# Patient Record
Sex: Male | Born: 1935 | Race: Black or African American | Hispanic: No | Marital: Married | Smoking: Former smoker
Health system: Southern US, Community
[De-identification: ages and names within clinical notes are randomized; demographics above are authoritative.]

## PROBLEM LIST (undated history)

## (undated) DIAGNOSIS — C649 Malignant neoplasm of unspecified kidney, except renal pelvis: Secondary | ICD-10-CM

## (undated) DIAGNOSIS — J449 Chronic obstructive pulmonary disease, unspecified: Secondary | ICD-10-CM

## (undated) DIAGNOSIS — J189 Pneumonia, unspecified organism: Secondary | ICD-10-CM

## (undated) DIAGNOSIS — R12 Heartburn: Secondary | ICD-10-CM

## (undated) DIAGNOSIS — M1990 Unspecified osteoarthritis, unspecified site: Secondary | ICD-10-CM

## (undated) DIAGNOSIS — E119 Type 2 diabetes mellitus without complications: Secondary | ICD-10-CM

## (undated) DIAGNOSIS — K219 Gastro-esophageal reflux disease without esophagitis: Secondary | ICD-10-CM

## (undated) DIAGNOSIS — I10 Essential (primary) hypertension: Secondary | ICD-10-CM

## (undated) HISTORY — DX: Heartburn: R12

## (undated) HISTORY — PX: OTHER SURGICAL HISTORY: SHX169

## (undated) HISTORY — PX: CHOLECYSTECTOMY: SHX55

## (undated) HISTORY — DX: Malignant neoplasm of unspecified kidney, except renal pelvis: C64.9

---

## 2001-06-06 ENCOUNTER — Ambulatory Visit (HOSPITAL_COMMUNITY): Admission: RE | Admit: 2001-06-06 | Discharge: 2001-06-06 | Payer: Self-pay | Admitting: Family Medicine

## 2001-06-06 ENCOUNTER — Encounter: Payer: Self-pay | Admitting: Family Medicine

## 2001-09-16 ENCOUNTER — Ambulatory Visit (HOSPITAL_COMMUNITY): Admission: RE | Admit: 2001-09-16 | Discharge: 2001-09-16 | Payer: Self-pay | Admitting: Family Medicine

## 2001-09-16 ENCOUNTER — Encounter: Payer: Self-pay | Admitting: Family Medicine

## 2002-06-16 ENCOUNTER — Ambulatory Visit (HOSPITAL_COMMUNITY): Admission: RE | Admit: 2002-06-16 | Discharge: 2002-06-16 | Payer: Self-pay | Admitting: Family Medicine

## 2002-06-17 ENCOUNTER — Encounter: Payer: Self-pay | Admitting: Family Medicine

## 2002-10-12 ENCOUNTER — Encounter: Payer: Self-pay | Admitting: Family Medicine

## 2002-10-12 ENCOUNTER — Ambulatory Visit (HOSPITAL_COMMUNITY): Admission: RE | Admit: 2002-10-12 | Discharge: 2002-10-12 | Payer: Self-pay | Admitting: Family Medicine

## 2003-06-01 ENCOUNTER — Emergency Department (HOSPITAL_COMMUNITY): Admission: EM | Admit: 2003-06-01 | Discharge: 2003-06-01 | Payer: Self-pay | Admitting: *Deleted

## 2004-03-21 ENCOUNTER — Ambulatory Visit (HOSPITAL_COMMUNITY): Admission: RE | Admit: 2004-03-21 | Discharge: 2004-03-21 | Payer: Self-pay | Admitting: Internal Medicine

## 2004-04-11 ENCOUNTER — Inpatient Hospital Stay (HOSPITAL_COMMUNITY): Admission: AD | Admit: 2004-04-11 | Discharge: 2004-04-15 | Payer: Self-pay | Admitting: Internal Medicine

## 2004-04-11 ENCOUNTER — Ambulatory Visit (HOSPITAL_COMMUNITY): Admission: RE | Admit: 2004-04-11 | Discharge: 2004-04-11 | Payer: Self-pay | Admitting: Internal Medicine

## 2004-04-20 ENCOUNTER — Ambulatory Visit (HOSPITAL_COMMUNITY): Admission: RE | Admit: 2004-04-20 | Discharge: 2004-04-20 | Payer: Self-pay | Admitting: Internal Medicine

## 2004-04-25 ENCOUNTER — Observation Stay (HOSPITAL_COMMUNITY): Admission: RE | Admit: 2004-04-25 | Discharge: 2004-04-27 | Payer: Self-pay | Admitting: General Surgery

## 2004-06-12 ENCOUNTER — Inpatient Hospital Stay (HOSPITAL_COMMUNITY): Admission: RE | Admit: 2004-06-12 | Discharge: 2004-06-16 | Payer: Self-pay | Admitting: Urology

## 2005-01-15 ENCOUNTER — Ambulatory Visit (HOSPITAL_COMMUNITY): Admission: RE | Admit: 2005-01-15 | Discharge: 2005-01-15 | Payer: Self-pay | Admitting: Internal Medicine

## 2005-02-26 ENCOUNTER — Ambulatory Visit (HOSPITAL_COMMUNITY): Admission: RE | Admit: 2005-02-26 | Discharge: 2005-02-26 | Payer: Self-pay | Admitting: Internal Medicine

## 2005-03-01 ENCOUNTER — Ambulatory Visit (HOSPITAL_COMMUNITY): Admission: RE | Admit: 2005-03-01 | Discharge: 2005-03-01 | Payer: Self-pay | Admitting: Internal Medicine

## 2006-09-23 ENCOUNTER — Ambulatory Visit (HOSPITAL_COMMUNITY): Admission: RE | Admit: 2006-09-23 | Discharge: 2006-09-23 | Payer: Self-pay | Admitting: Internal Medicine

## 2007-07-03 ENCOUNTER — Encounter (INDEPENDENT_AMBULATORY_CARE_PROVIDER_SITE_OTHER): Payer: Self-pay | Admitting: General Surgery

## 2007-07-03 ENCOUNTER — Other Ambulatory Visit: Admission: RE | Admit: 2007-07-03 | Discharge: 2007-07-03 | Payer: Self-pay | Admitting: General Surgery

## 2007-07-07 ENCOUNTER — Ambulatory Visit (HOSPITAL_COMMUNITY): Admission: RE | Admit: 2007-07-07 | Discharge: 2007-07-07 | Payer: Self-pay | Admitting: Internal Medicine

## 2007-11-05 ENCOUNTER — Ambulatory Visit (HOSPITAL_COMMUNITY): Admission: RE | Admit: 2007-11-05 | Discharge: 2007-11-05 | Payer: Self-pay | Admitting: Internal Medicine

## 2010-09-04 ENCOUNTER — Ambulatory Visit (HOSPITAL_COMMUNITY): Admission: RE | Admit: 2010-09-04 | Discharge: 2010-09-05 | Payer: Self-pay | Admitting: Orthopedic Surgery

## 2010-11-26 ENCOUNTER — Encounter: Payer: Self-pay | Admitting: Urology

## 2011-01-17 LAB — CBC
HCT: 43.5 % (ref 39.0–52.0)
Hemoglobin: 14 g/dL (ref 13.0–17.0)
MCHC: 32.2 g/dL (ref 30.0–36.0)
Platelets: 138 10*3/uL — ABNORMAL LOW (ref 150–400)
RDW: 12.5 % (ref 11.5–15.5)

## 2011-01-17 LAB — BASIC METABOLIC PANEL
Calcium: 10 mg/dL (ref 8.4–10.5)
Chloride: 104 mEq/L (ref 96–112)
Creatinine, Ser: 0.84 mg/dL (ref 0.4–1.5)
GFR calc non Af Amer: >60 mL/min (ref >60)
Potassium: 3.9 mEq/L (ref 3.5–5.1)

## 2011-03-23 NOTE — H&P (Signed)
NAME:  Hayden Green, CAVALLERO NO.:  [id number]   MEDICAL RECORD NO.:  [id number]                   PATIENT TYPE:  AMB   LOCATION:  DAY                                  FACILITY:  [facility]   PHYSICIAN:  Jerolyn Shin C. Katrinka Blazing, M.D.                DATE OF BIRTH:  1928/03/28   DATE OF ADMISSION:  DATE OF DISCHARGE:                                HISTORY & PHYSICAL   HISTORY OF PRESENT ILLNESS:  [age]-year-old male with chronic  cholecystitis.  The patient was recently admitted to the hospital for  treatment of  _________.  Please refer to the H&P during that admission.  Since discharge from the hospital the patient has been stable with no nausea  or vomiting.  He has been counseled for laparoscopic cholecystectomy.   PAST HISTORY:  The past history is positive for:  1. Hypertension.  2. Gastroesophageal reflux disease.  3. Hyperlipidemia.  4. Chronic bronchitis.   PHYSICAL EXAMINATION:  GENERAL APPEARANCE:  On exam the patient is in no  acute distress.  VITAL SIGNS:  Blood pressure 120/70, pulse 72, respirations 20 and weight  198 pounds.  HEENT:  Unremarkable.  NECK:  Neck is supple without JVD or bruit.  CHEST:  Chest is clear to auscultation.  HEART:  Regular rate and rhythm without murmur, gallop or rub.  ABDOMEN:  Abdomen is soft and nontender.  No masses.  EXTREMITIES:  No cyanosis, clubbing or edema.  NEUROLOGIC EXAMINATION:  No focal motor, sensory or cerebellar deficits.   IMPRESSION:  1. Chronic cholecystitis.  2. Gastroesophageal reflux disease.  3. Hypertension.  4. Chronic bronchitis.  5. Hyperlipidemia.   PLAN:  Laparoscopic cholecystectomy.   NOTE:  Nursing is to attach a copy of his recent history and physical to  this to complete his history and physical information.     ___________________________________________                                         Dirk Dress Katrinka Blazing, M.D.   LCS/MEDQ  D:  04/24/2004  T:  04/25/2004  Job:   [id number]   cc:   Tesfaye D. Felecia Shelling, M.D.  [address]  [place]  [place] [address]  Fax: [phone]

## 2011-03-23 NOTE — H&P (Signed)
NAME:  Hayden Green, Hayden Green NO.:  [id number]   MEDICAL RECORD NO.:  [id number]                   PATIENT TYPE:  INP   LOCATION:  A323                                 FACILITY:  [facility]   PHYSICIAN:  Tesfaye D. Felecia Shelling, M.D.              DATE OF BIRTH:  09-25-1928   DATE OF ADMISSION:  04/11/2004  DATE OF DISCHARGE:                                HISTORY & PHYSICAL   CHIEF COMPLAINT:  Abdominal pain of four days duration.   HISTORY OF PRESENT ILLNESS:  This is a 77 year old male patient with a  history of hypertension and gastroesophageal reflux disease who came to the  office due to the bowel complaints.  The patient had a severe pain about  four days back, which gradually got better after two days; however, he  continued to have intermittent, crampy pain and nausea.  The patient was  evaluated in the office, and ultrasound of the abdomen was done on as an  outpatient.  His ultrasound showed dilated common bile ducts with  gallbladder sludge __________ .  No stone was visualized in the common bile  duct.  The patient was then admitted for further evaluation and possible  surgery.   REVIEW OF SYSTEMS:  No fevers or chills, cough, chest pain, or shortness of  breath, dysuria, or frequency of urination.  The patient has nausea and  generalized weakness.  His appetite has been very poor.   PAST MEDICAL HISTORY:  1. Hypertension.  2. Gastroesophageal reflux disease.   CURRENT MEDICATIONS:  1. Protonix 40 mg p.o. q.d.  2. Mavik 2 mg p.o. q.d.   SOCIAL HISTORY:  Patient is a retired Education officer, environmental.  Patient has been smoking for  several years.  There is no history of alcohol or substance abuse.   PHYSICAL EXAMINATION:  VITAL SIGNS:  Blood pressure 135/84, pulse 69,  respiration 20, temperature 97.5 degrees Fahrenheit.  GENERAL:  Patient is awake, alert and sick-looking.  LUNGS:  Clear lung fields.  Good air entry.  HEART:  First and second heart sounds heard.  No  murmur or gallop.  ABDOMEN:  Soft.  Lax.  Bowel sounds are positive.  No mass.  No  organomegaly.  EXTREMITIES:  No leg edema.   LABS ON ADMISSION:  CBC:  WBC 7.6, hemoglobin 13.2, hematocrit 41.3,  platelets 205.  Sodium 137, potassium 3.9, chloride 101, carbon dioxide 30,  glucose 120, BUN 12, creatinine 1.1.  Total bilirubin 0.4.  Alkaline  phosphatase 88.  SGOT 18.  SGPT 15.  Total protein 7.5.  Albumin 4.0.  Calcium 7.97.   ASSESSMENT:  This is a 77 year old male patient who presented with abdominal  pain of four days duration.  His pain currently has improved.  His  ultrasound has shown gallbladder sludge and crystal with a dilated common  bile duct; however, there was no visualized stone in his common  bile duct.  His liver function tests are also normal.  Patient could have common bile  duct stone that has passed.   PLAN:  1. Will repeat his LFTs.  2. Will do a GI consult.  3. Will do a surgical consult.  4. Continue his regular medications.     ___________________________________________                                         Eustaquio Maize Felecia Shelling, M.D.   TDF/MEDQ  D:  04/11/2004  T:  04/11/2004  Job:  [id number]

## 2011-03-23 NOTE — Op Note (Signed)
NAME:  Hayden Green, Hayden Green NO.:  [id number]   MEDICAL RECORD NO.:  [id number]                   PATIENT TYPE:  INP   LOCATION:  IC10                                 FACILITY:  [facility]   PHYSICIAN:  Ky Barban, M.D.            DATE OF BIRTH:  03/04/1928   DATE OF PROCEDURE:  06/12/2004  DATE OF DISCHARGE:                                 OPERATIVE REPORT   PREOPERATIVE DIAGNOSIS:  Right renal cell carcinoma.   POSTOPERATIVE DIAGNOSIS:  Right renal cell carcinoma.   PROCEDURE:  Right partial nephrectomy.   ANESTHESIA:  General endotracheal.   SURGEON:  Dr. Alleen Borne   ASSISTANT:  Dr. Dennie Maizes   ESTIMATED BLOOD LOSS:  75 mL.   INSTRUMENT NEEDLE AND SPONGE COUNT:  Correct.   FROZEN SECTION:  Renal cell carcinoma.  Margins clear.   DESCRIPTION OF PROCEDURE:  The patient is given general endotracheal  anesthesia, placed in 45-degree right side up.  Subcostal incision is made,  starting from the midline, ending at the level just below the 12th rib.  The  fascial layers are divided.  The peritoneum was opened.  Here previous  laparoscopic cholecystectomy and a lot of adhesions and scar tissue in the  tracts.  With blunt and sharp dissection, the omental adhesions were  separated from the inside of the peritoneum.  The colon was reflected  medially, and Gerota's fascia was opened posterolateral aspect, and the  lower pole of the kidney was exposed.  I had to open the posterior  peritoneum to open the Gerota's fascia, and the incision in the peritoneum  was carried towards the upper pole.  The upper pole was pulled down with the  help of the retractor and holding the site of the Gerota's fascia with Allis  clamps, the nodule is nicely exposed.  Then under direct vision, this area  was circumscribed and the nodule was sharply taken out.  Grossly, the  margins looked clear.  The specimen was sent for frozen section.  It was  renal cell  carcinoma with clear margins.  The cavity was then pegged with  Gelfoam and with minimal pressure.  After a few minutes, there is no  bleeding going on.  The peritoneum covering this area was closed with a  running stitch of 3-0 Vicryl.  The operative site was thoroughly irrigated  with saline.  The area was left bagged, and the remaining part of the  Gerota's fascia was also closed, and then the area was left bagged, and then  I put a suture to close the incision.  The peritoneum and the inner layer of  the fascia was closed with running stitch of 0 Vicryl and at the end, the  bag which was in the area of the operation was removed.  It was completely  dry.  There was no bleeding going on.  Now, the  second layer of the fascia  was closed with a running stitch of 3-0 Vicryl.  The skin was closed with  staples.  The patient lost about 75 mL of blood,  remained stable, no complications.  Decided not to leave any drain because  we did not violate the collecting system, and there was no bleeding going  on.  Sterile gauze dressing applied.  The patient left the operating room in  satisfactory condition.      ___________________________________________                                            Ky Barban, M.D.   MIJ/MEDQ  D:  06/12/2004  T:  06/12/2004  Job:  [id number]   cc:   Annia Friendly. Loleta Chance, M.D.  [address]  [place]  [place] [address]  Fax: [phone]

## 2011-03-23 NOTE — Discharge Summary (Signed)
NAME:  Hayden Green, Hayden Green NO.:  [id number]   MEDICAL RECORD NO.:  [id number]                   PATIENT TYPE:  INP   LOCATION:  A323                                 FACILITY:  [facility]   PHYSICIAN:  Tesfaye D. Felecia Shelling, M.D.              DATE OF BIRTH:  [birth year]   DATE OF ADMISSION:  04/11/2004  DATE OF DISCHARGE:  04/15/2004                                 DISCHARGE SUMMARY   DISCHARGE DIAGNOSES:  1. Cholecystitis.  2. Dilated common bile duct.  3. Gastroesophageal reflux disease.  4. Hypertension.   DISCHARGE MEDICATIONS:  1. Mavik 2 mg p.o. daily.  2. Protonix 40 mg p.o. daily.   DISPOSITION:  The patient is discharged home in a stable condition.   HOSPITAL COURSE:  This is a 77 year old male patient with a history of  hypertension and gastroesophageal reflux disease.  He was admitted to due to  abdominal pain of four days duration.  An ultrasound that was done as an  outpatient showed a dilated common bile duct with gallstones and sludge in  his gallbladder.  The patient was admitted and a GI and surgical consult was  done.  His liver function was within normal limits.  The patient had an  endoscopy done.  He remained symptomatic.  His nausea and vomiting and  abdominal pain has resolved.  The patient was discharged home with an  appointment with surgery for elective cholecystectomy.     ___________________________________________                                         Eustaquio Maize Felecia Shelling, M.D.   TDF/MEDQ  D:  05/01/2004  T:  05/01/2004  Job:  [id number]

## 2011-03-23 NOTE — Op Note (Signed)
NAME:  Hayden Green, Hayden Green NO.:  [id number]   MEDICAL RECORD NO.:  [id number]                   PATIENT TYPE:  INP   LOCATION:  IC07                                 FACILITY:  [facility]   PHYSICIAN:  Dirk Dress. Katrinka Blazing, M.D.                DATE OF BIRTH:  September 18, 1928   DATE OF PROCEDURE:  04/25/2004  DATE OF DISCHARGE:                                 OPERATIVE REPORT   PREOPERATIVE DIAGNOSES:  Chronic cholecystitis.   POSTOPERATIVE DIAGNOSES:  Chronic cholecystitis.   PROCEDURE:  Laparoscopic cholecystectomy.   SURGEON:  Dirk Dress. Katrinka Blazing, M.D.   DESCRIPTION OF PROCEDURE:  Under general anesthesia, the patient's abdomen  was prepped and draped in a sterile field.  Supraumbilical incision was made  and Veress needle was inserted uneventfully.  Abdomen was insufflated with 3  L of CO2.  Using a Visiport guide, a 10 mm port was placed without  difficulty.  Laparoscope was placed.  Under videoscopic guidance, a 10 mm  port and two 5 mm ports were placed in the right subcostal region.  The  patient was placed in reverse Trendelenburg position, and the gallbladder  was grasped.  Cystic duct and the cystic artery were densely adherent and  chronically inflamed.  After lysed dissection the cystic duct was followed  back to its junction with the common bile duct and to its juncture with the  gallbladder.  At its junction with the gallbladder, four clips were placed.  The duct was then divided.  Cystic artery had 3 branches.  These were  clipped with 3 clips each and divided.  Next, using electrocautery, the  gallbladder was separated from the infrahepatic bed without difficulty.  The  posterior wall of the gallbladder was fibrotic and densely adherent to the  liver.  In the upper aspect of the liver bed, there was an area of constant  oozing.  This was cauterized and then covered with a large sheet of  Surgicel.  J-P drain was placed and brought out through the lateral  port.  Irrigation was carried out.  CO2 was allowed to escape from the abdomen, and  the ports were removed.  The incisions were then closed using 0 Dexon on the  fascia of the supraumbilical port.  The other incisions were closed with  staples.  The J-P drain was sutured with 3-0 nylon.  Sterile dressings were  placed.  The patient was awakened from anesthesia uneventfully, transferred  to a bed, and taken to the postanesthetic care unit for further monitoring.      ___________________________________________                                            Dirk Dress. Katrinka Blazing, M.D.   LCS/MEDQ  D:  04/25/2004  T:  04/26/2004  Job:  [id number]   cc:   Tesfaye D. Felecia Shelling, M.D.  [address]  [place]  [place] [address]  Fax: [phone]

## 2011-03-23 NOTE — Consult Note (Signed)
NAME:  Hayden Green, Hayden Green                            ACCOUNT NO.:  [id number]   MEDICAL RECORD NO.:  [id number]                   PATIENT TYPE:  INP   LOCATION:  A323                                 FACILITY:  [facility]   PHYSICIAN:  Lionel December, M.D.                 DATE OF BIRTH:  [birth year]   DATE OF CONSULTATION:  04/12/2004  DATE OF DISCHARGE:                                   CONSULTATION   GASTROENTEROLOGY CONSULTATION:   REASON FOR CONSULTATION:  Abdominal pain, mildly dilated bile duct.   HISTORY OF PRESENT ILLNESS:  Hayden Green is a 77 year old African-American male  who was admitted to Dr. Letitia Neri service yesterday with recent onset of  abdominal pain.   Patient states he has not felt well for the last 10 days or so.  He had mild  vague pain in his abdomen.  However, for 3 days in a row over the weekend he  had generalized abdominal pain with bloating.  The pain at times was intense  and cramping.  He was not able to rest at night.  He did call Dr. Letitia Neri  office Friday afternoon but it was too late.  He decided not to come to the  emergency room and instead waited.  Two days ago pain had eased.  He saw Dr.  Felecia Shelling.  He had upper abdominal ultrasound yesterday which revealed  gallbladder sludge and/or crystals.  Bile duct was mildly dilated measuring  10 mm.  No common duct stone was noted.  No abnormality noted to pancreas or  kidneys.  Patient was hospitalized for further management.  He states most  of his pain is gone.  He has very mild pain in epigastric area and he feels  bloated.  Patient denies nausea, vomiting, fever or chills.  He also denies  hematuria or dysuria.  He does not have history of kidney stones.  Patient  states that he has not had a good appetite for the last few days and has  lost 8 pounds.  Over the weekend he felt he was constipated.  He took Pepto-  Bismol followed by milk of magnesia and he finally had good results.  There  is no history of melena or  rectal bleeding.  He states his heartburn is well  controlled with therapy.   He is on Pepcid 20 mg b.i.d. and Mavik 2 mg once daily.   He is presently on Mavik 2 mg once daily, Protonix 40 mg every morning, and  Dilaudid 3 mg IV q.3h. p.r.n. pain.   PAST MEDICAL HISTORY:  Chronic GERD.  He has hypertension.  Several years  ago he had what appears to be TURP.   ALLERGIES:  REGLAN.  He had swelling to his mouth and face after first  couple of doses.   FAMILY HISTORY:  One brother died of prostate and perhaps liver malignancy  at age  46.  He has another brother and sister who are in good health.   SOCIAL HISTORY:  He is married; he has four healthy children.  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  He smoked cigarettes for 20-25 years, he was never a heavy smoker and  quit 3 weeks ago.  He does not drink alcohol.   PHYSICAL EXAMINATION:  Pleasant well-developed, well-nourished African-  American male who is in no acute distress, admission weight 191.1 pounds and  he is 66 inches tall.  Pulse 65 per minute, blood pressure 119/68,  respirations 18 and temperature is 98.  Conjunctivae are pink, sclerae are  nonicteric, oropharyngeal mucosa is normal, dentition in satisfactory  condition.  Neck without masses or thyromegaly.  Cardiac exam with regular  rhythm, normal S1 and S3, no murmur or gallop noted.  Lungs are clear to  auscultation.  Abdomen is full, bowel sounds are normal, no bruit is noted,  on palpation it is soft and no definite tenderness noted.  No organomegaly  or masses.  Rectal exam is deferred.  He does not have inguinal hernia.  Examination of his genitalia is within normal limits.  He does not have  clubbing or peripheral edema.   LABORATORIES FROM ADMISSION:  WBC 7.6, H&H 13.2 and 41.3, MCV is 86.3,  platelet count 205K, he had 10% eosinophils.  Sodium 137, potassium 3.9,  chloride 101, CO2 is 30, glucose 120, BUN  12, creatinine 1.1, bilirubin is  0.4, AP 88, AST 18, ALT 15, total protein 7.5 with albumin of 4 and calcium  9.7.  His LFTs are repeated today and they are normal.   ASSESSMENT:  Hayden Green is a 77 year old African-American male who presents  with few day history of generalized abdominal pain described to be quite  intense and cramping associated with anorexia and few pound weight loss.  There is no history of fever, chills, nausea, vomiting, hematemesis, melena,  rectal bleeding, hematuria or dysuria.  He does have sludge and/or crystals  in his gallbladder.  Bile duct is 10 mm but there is no evidence of  choledocholithiasis.  His transaminases are normal.   His abdominal pain appears to be rather nonspecific.  He could have partial  small-bowel obstruction or small bowel disease such as eosinophilic  enteritis but 10% eosinophil count may just be nonspecific.  I agree a  colonic neoplasm needs to be ruled out although he does not have any  symptoms or signs to suggest this.  Dr. Katrinka Blazing has already evaluated the  patient and is planning EGD and colonoscopy.  If these studies are normal I  feel he would need abdominopelvic CT.  So far it does not appear that his  symptoms are biliary in origin.   RECOMMENDATIONS:  We will repeat a CBC with differential in the morning,  also check urinalysis and ask lab to do serum amylase and lipase on  admission blood.   We would like to thank Dr. Felecia Shelling for the opportunity to participate in the  care of this gentleman.      ___________________________________________                                            Lionel December, M.D.   NR/MEDQ  D:  04/12/2004  T:  04/13/2004  Job:  [id number]

## 2011-03-23 NOTE — Consult Note (Signed)
NAME:  Hayden Green, Hayden Green NO.:  [id number]   MEDICAL RECORD NO.:  [id number]                   PATIENT TYPE:  INP   LOCATION:  A323                                 FACILITY:  [facility]   PHYSICIAN:  Dirk Dress. Katrinka Blazing, M.D.                DATE OF BIRTH:  1928/09/30   DATE OF CONSULTATION:  DATE OF DISCHARGE:                                   CONSULTATION   HISTORY OF PRESENT ILLNESS:  This is a 77 year old male who gives a history  of onset of midabdominal pain about a week ago.  The pain was in the mid  periumbilical area.  It was crampy.  He had episodes of nausea but did not  have vomiting.  The pain seemed to get slightly better after a few days, but  then became more severe.  It was extremely severe over the weekend.  He was  seen in the office on Monday of this week and evaluated by Dr. Felecia Shelling.  Ultrasound of the abdomen was done and this showed gallbladder sludge and  common bile duct at 1 cm without any intraductal abnormities.  The patient  has been admitted for symptomatic treatment and further investigation.  Labs  reveal his white count to be normal.  Hemoglobin and hematocrit are normal.  His total bilirubin is 0.4, alkaline phosphatase 88.  SGPT 15, SGOT 18.  He  remains mildly symptomatic with midabdominal discomfort and nausea.   PAST MEDICAL HISTORY:  1. Positive only for hypertension.  2. Gastroesophageal reflux disease.   PHYSICAL EXAMINATION:  VITAL SIGNS:  Blood pressure is 119/68, pulse 65,  respirations 18, temperature 98 degrees.  HEENT:  Unremarkable.  NECK:  Supple.  CHEST:  Clear to auscultation.  HEART:  Regular rate and rhythm without murmur, gallop, or rub.  ABDOMEN:  Mildly distended with doughy quality.  I am unable to get him to  fully relax his abdominal wall.  He has no tenderness and no palpable  organomegaly.  There is no shifting dullness.  EXTREMITIES:  No cyanosis, clubbing, or edema.  NEUROLOGIC:  Nonfocal.   IMPRESSION:  1. Recurrent abdominal pain with associated nausea, abdominal bloating with     change in bowel habits.  Symptoms seem to be exacerbated by meals and     made better with passage of bowel movement.  2. Biliary sludge with a dilated duct of 1 cm but without intraductal     abnormality.  3. Gastroesophageal reflux disease.  4. Hypertension.   RECOMMENDATIONS:  The patient's history and clinical findings are somewhat  atypical for biliary tract disease, though this may very well be the  problem.  Before proceeding with cholecystectomy, however, it is felt that  it would be better to do upper and lower endoscopy.  If this is normal, then  a full abdominal CT would be in order to make  sure that we are not missing some other intraperitoneal pathology.  If these  findings are normal, removal of the gallbladder would then be in order.  This was discussed with the family as well as with the patient and we will  proceed with bowel prep and upper and lower endoscopy in the morning.      ___________________________________________                                            Dirk Dress. Katrinka Blazing, M.D.   LCS/MEDQ  D:  04/12/2004  T:  04/12/2004  Job:  [id number]   cc:   Tesfaye D. Felecia Shelling, M.D.  [address]  [place]  [place] [address]  Fax: [phone]

## 2011-05-16 ENCOUNTER — Ambulatory Visit (HOSPITAL_COMMUNITY)
Admission: RE | Admit: 2011-05-16 | Discharge: 2011-05-16 | Disposition: A | Discharge status: Home / Self Care | Payer: Medicare Other | Source: Ambulatory Visit | Attending: Ophthalmology | Admitting: Ophthalmology

## 2011-05-16 DIAGNOSIS — H269 Unspecified cataract: Secondary | ICD-10-CM | POA: Insufficient documentation

## 2011-05-16 DIAGNOSIS — J449 Chronic obstructive pulmonary disease, unspecified: Secondary | ICD-10-CM | POA: Insufficient documentation

## 2011-05-16 DIAGNOSIS — F17200 Nicotine dependence, unspecified, uncomplicated: Secondary | ICD-10-CM | POA: Insufficient documentation

## 2011-05-16 DIAGNOSIS — J4489 Other specified chronic obstructive pulmonary disease: Secondary | ICD-10-CM | POA: Insufficient documentation

## 2011-05-16 DIAGNOSIS — I10 Essential (primary) hypertension: Secondary | ICD-10-CM | POA: Insufficient documentation

## 2011-05-16 LAB — CBC
MCHC: 33.2 g/dL (ref 30.0–36.0)
Platelets: 135 10*3/uL — ABNORMAL LOW (ref 150–400)
RBC: 4.48 MIL/uL (ref 4.22–5.81)
WBC: 7.4 10*3/uL (ref 4.0–10.5)

## 2011-05-16 LAB — BASIC METABOLIC PANEL
CO2: 29 mEq/L (ref 19–32)
Creatinine, Ser: 0.77 mg/dL (ref 0.50–1.35)
GFR calc non Af Amer: >60 mL/min (ref >60)
Sodium: 141 mEq/L (ref 135–145)

## 2011-09-07 ENCOUNTER — Ambulatory Visit (HOSPITAL_COMMUNITY)
Admission: RE | Admit: 2011-09-07 | Discharge: 2011-09-07 | Disposition: A | Discharge status: Home / Self Care | Payer: Medicare Other | Source: Ambulatory Visit | Attending: Internal Medicine | Admitting: Internal Medicine

## 2011-09-07 ENCOUNTER — Inpatient Hospital Stay (HOSPITAL_COMMUNITY): Payer: Medicare Other

## 2011-09-07 ENCOUNTER — Encounter (HOSPITAL_COMMUNITY): Payer: Self-pay | Admitting: *Deleted

## 2011-09-07 ENCOUNTER — Other Ambulatory Visit (HOSPITAL_COMMUNITY): Payer: Self-pay | Admitting: Internal Medicine

## 2011-09-07 ENCOUNTER — Other Ambulatory Visit: Payer: Self-pay

## 2011-09-07 ENCOUNTER — Inpatient Hospital Stay (HOSPITAL_COMMUNITY)
Admission: AD | Admit: 2011-09-07 | Discharge: 2011-09-11 | DRG: 195 | Disposition: A | Discharge status: Home / Self Care | Payer: Medicare Other | Source: Ambulatory Visit | Attending: Internal Medicine | Admitting: Internal Medicine

## 2011-09-07 DIAGNOSIS — J449 Chronic obstructive pulmonary disease, unspecified: Secondary | ICD-10-CM

## 2011-09-07 DIAGNOSIS — E669 Obesity, unspecified: Secondary | ICD-10-CM | POA: Diagnosis present

## 2011-09-07 DIAGNOSIS — J4489 Other specified chronic obstructive pulmonary disease: Secondary | ICD-10-CM | POA: Diagnosis present

## 2011-09-07 DIAGNOSIS — J189 Pneumonia, unspecified organism: Principal | ICD-10-CM | POA: Diagnosis present

## 2011-09-07 DIAGNOSIS — R918 Other nonspecific abnormal finding of lung field: Secondary | ICD-10-CM | POA: Insufficient documentation

## 2011-09-07 DIAGNOSIS — E785 Hyperlipidemia, unspecified: Secondary | ICD-10-CM | POA: Diagnosis present

## 2011-09-07 DIAGNOSIS — I10 Essential (primary) hypertension: Secondary | ICD-10-CM | POA: Diagnosis present

## 2011-09-07 DIAGNOSIS — Z87891 Personal history of nicotine dependence: Secondary | ICD-10-CM

## 2011-09-07 DIAGNOSIS — R05 Cough: Secondary | ICD-10-CM | POA: Insufficient documentation

## 2011-09-07 DIAGNOSIS — R7309 Other abnormal glucose: Secondary | ICD-10-CM | POA: Diagnosis present

## 2011-09-07 DIAGNOSIS — R059 Cough, unspecified: Secondary | ICD-10-CM | POA: Insufficient documentation

## 2011-09-07 DIAGNOSIS — T380X5A Adverse effect of glucocorticoids and synthetic analogues, initial encounter: Secondary | ICD-10-CM | POA: Diagnosis present

## 2011-09-07 DIAGNOSIS — R0602 Shortness of breath: Secondary | ICD-10-CM | POA: Insufficient documentation

## 2011-09-07 HISTORY — DX: Unspecified osteoarthritis, unspecified site: M19.90

## 2011-09-07 HISTORY — DX: Pneumonia, unspecified organism: J18.9

## 2011-09-07 HISTORY — DX: Essential (primary) hypertension: I10

## 2011-09-07 HISTORY — DX: Gastro-esophageal reflux disease without esophagitis: K21.9

## 2011-09-07 LAB — CBC
HCT: 39.7 % (ref 39.0–52.0)
Hemoglobin: 12.7 g/dL — ABNORMAL LOW (ref 13.0–17.0)
MCHC: 32 g/dL (ref 30.0–36.0)
MCV: 87.3 fL (ref 78.0–100.0)
RDW: 12.7 % (ref 11.5–15.5)

## 2011-09-07 LAB — DIFFERENTIAL
Basophils Absolute: 0 10*3/uL (ref 0.0–0.1)
Basophils Relative: 0 % (ref 0–1)
Eosinophils Relative: 0 % (ref 0–5)
Lymphocytes Relative: 10 % — ABNORMAL LOW (ref 12–46)
Monocytes Absolute: 0.2 10*3/uL (ref 0.1–1.0)
Monocytes Relative: 2 % — ABNORMAL LOW (ref 3–12)

## 2011-09-07 LAB — COMPREHENSIVE METABOLIC PANEL
ALT: 9 U/L (ref 0–53)
Albumin: 3.9 g/dL (ref 3.5–5.2)
Alkaline Phosphatase: 78 U/L (ref 39–117)
Potassium: 3.9 mEq/L (ref 3.5–5.1)
Sodium: 133 mEq/L — ABNORMAL LOW (ref 135–145)
Total Protein: 7.2 g/dL (ref 6.0–8.3)

## 2011-09-07 MED ORDER — IOHEXOL 300 MG/ML  SOLN
80.0000 mL | Freq: Once | INTRAMUSCULAR | Status: AC | PRN
  Administered 2011-09-07: 80 mL via INTRAVENOUS

## 2011-09-07 MED ORDER — CEFTRIAXONE SODIUM 1 G IJ SOLR
1.0000 g | INTRAMUSCULAR | Status: DC
  Filled 2011-09-07 (×3): qty 10

## 2011-09-07 MED ORDER — DEXTROSE 5 % IV SOLN
INTRAVENOUS | Status: AC
  Filled 2011-09-07: qty 500

## 2011-09-07 MED ORDER — SODIUM CHLORIDE 0.45 % IV SOLN
INTRAVENOUS | Status: DC
  Administered 2011-09-07 – 2011-09-08 (×2): via INTRAVENOUS

## 2011-09-07 MED ORDER — IPRATROPIUM BROMIDE 0.02 % IN SOLN
0.5000 mg | RESPIRATORY_TRACT | Status: DC
  Administered 2011-09-07 – 2011-09-11 (×14): 0.5 mg via RESPIRATORY_TRACT
  Filled 2011-09-07 (×13): qty 2.5

## 2011-09-07 MED ORDER — METHYLPREDNISOLONE SODIUM SUCC 125 MG IJ SOLR
125.0000 mg | Freq: Four times a day (QID) | INTRAMUSCULAR | Status: DC
  Administered 2011-09-07 – 2011-09-09 (×7): 125 mg via INTRAVENOUS
  Filled 2011-09-07 (×7): qty 2

## 2011-09-07 MED ORDER — ALBUTEROL SULFATE (5 MG/ML) 0.5% IN NEBU
2.5000 mg | INHALATION_SOLUTION | RESPIRATORY_TRACT | Status: DC
  Administered 2011-09-07 – 2011-09-11 (×14): 2.5 mg via RESPIRATORY_TRACT
  Filled 2011-09-07 (×13): qty 0.5

## 2011-09-07 MED ORDER — DEXTROSE 5 % IV SOLN
500.0000 mg | INTRAVENOUS | Status: DC
  Administered 2011-09-07 – 2011-09-10 (×4): 500 mg via INTRAVENOUS
  Filled 2011-09-07 (×4): qty 500

## 2011-09-07 MED ORDER — LISINOPRIL 10 MG PO TABS
20.0000 mg | ORAL_TABLET | Freq: Every day | ORAL | Status: DC
  Administered 2011-09-07 – 2011-09-11 (×5): 20 mg via ORAL
  Filled 2011-09-07 (×2): qty 2
  Filled 2011-09-07: qty 1
  Filled 2011-09-07 (×2): qty 2

## 2011-09-07 MED ORDER — PANTOPRAZOLE SODIUM 40 MG PO TBEC
40.0000 mg | DELAYED_RELEASE_TABLET | Freq: Every day | ORAL | Status: DC
  Administered 2011-09-07 – 2011-09-10 (×4): 40 mg via ORAL
  Filled 2011-09-07 (×4): qty 1

## 2011-09-07 MED ORDER — DEXTROSE 5 % IV SOLN
INTRAVENOUS | Status: AC
  Filled 2011-09-07: qty 10

## 2011-09-08 ENCOUNTER — Encounter (HOSPITAL_COMMUNITY): Payer: Self-pay | Admitting: *Deleted

## 2011-09-08 LAB — URINALYSIS, ROUTINE W REFLEX MICROSCOPIC
Protein, ur: NEGATIVE mg/dL
pH: 5.5 (ref 5.0–8.0)

## 2011-09-08 LAB — GLUCOSE, CAPILLARY
Glucose-Capillary: 407 mg/dL — ABNORMAL HIGH (ref 70–99)
Glucose-Capillary: 356 mg/dL — ABNORMAL HIGH (ref 70–99)

## 2011-09-08 LAB — HEMOGLOBIN A1C: Hgb A1c MFr Bld: 6.4 % — ABNORMAL HIGH (ref <5.7)

## 2011-09-08 MED ORDER — INSULIN ASPART 100 UNIT/ML ~~LOC~~ SOLN
0.0000 [IU] | Freq: Every day | SUBCUTANEOUS | Status: DC
  Administered 2011-09-08: 5 [IU] via SUBCUTANEOUS

## 2011-09-08 MED ORDER — INSULIN GLARGINE 100 UNIT/ML ~~LOC~~ SOLN
20.0000 [IU] | Freq: Every day | SUBCUTANEOUS | Status: DC
  Administered 2011-09-08 – 2011-09-10 (×3): 20 [IU] via SUBCUTANEOUS
  Filled 2011-09-08: qty 3

## 2011-09-08 MED ORDER — INSULIN ASPART 100 UNIT/ML ~~LOC~~ SOLN
0.0000 [IU] | Freq: Three times a day (TID) | SUBCUTANEOUS | Status: DC
  Administered 2011-09-09: 15 [IU] via SUBCUTANEOUS
  Administered 2011-09-09: 7 [IU] via SUBCUTANEOUS
  Administered 2011-09-09: 15 [IU] via SUBCUTANEOUS
  Administered 2011-09-10: 7 [IU] via SUBCUTANEOUS
  Administered 2011-09-10: 11 [IU] via SUBCUTANEOUS
  Administered 2011-09-10: 15 [IU] via SUBCUTANEOUS
  Filled 2011-09-08 (×2): qty 3

## 2011-09-08 MED ORDER — DEXTROSE 5 % IV SOLN
1.0000 g | INTRAVENOUS | Status: DC
  Administered 2011-09-08 – 2011-09-10 (×3): 1 g via INTRAVENOUS
  Filled 2011-09-08 (×4): qty 10

## 2011-09-08 MED ORDER — INSULIN ASPART 100 UNIT/ML ~~LOC~~ SOLN: 0.0000 [IU] | Freq: Three times a day (TID) | SUBCUTANEOUS | Status: DC

## 2011-09-08 MED ORDER — INSULIN ASPART 100 UNIT/ML ~~LOC~~ SOLN
20.0000 [IU] | Freq: Once | SUBCUTANEOUS | Status: AC
  Administered 2011-09-08: 20 [IU] via SUBCUTANEOUS

## 2011-09-08 NOTE — Progress Notes (Signed)
NAME:  KYLEN, SCHLIEP NO.:  [id number]  MEDICAL RECORD NO.:  [id number]  LOCATION:  A311                          FACILITY:  [facility]  PHYSICIAN:  Neesa Knapik D. Felecia Shelling, MD   DATE OF BIRTH:  Sep 10, 1928  DATE OF PROCEDURE:  09/08/2011 DATE OF DISCHARGE:  09/07/2011                                PROGRESS NOTE   SUBJECTIVE:  The patient feels slightly better.  His cough and wheezing is less.  No fever or chills.  OBJECTIVE:  GENERAL:  The patient is alert, awake, and sick looking. VITAL SIGNS:  Blood pressure 136/59, pulse 69, respiratory rate 18, temperature 97.8 degrees Fahrenheit. CHEST:  Decreased air entry, bilateral rhonchi, and a few expiratory wheezes. CARDIOVASCULAR:  First and second heart sounds heard.  No murmur.  No gallop.  ABDOMEN:  Soft and lax.  Bowel sound is positive.  No mass or organomegaly.  EXTREMITIES:  No leg edema.  ASSESSMENT: 1. Community-acquired pneumonia. 2. Chronic obstructive pulmonary disease. 3. Hyperglycemia. 4. History of hypertension. 5. Hyperlipidemia.  PLAN:  We will continue the patient on IV antibiotics.  Continue on IV steroid.  We will continue to monitor his BNP.  We will do hemoglobin A1c.     Soma Bachand D. Felecia Shelling, MD     TDF/MEDQ  D:  09/08/2011  T:  09/08/2011  Job:  [id number]

## 2011-09-08 NOTE — H&P (Signed)
NAME:  Hayden Green, Hayden Green NO.:  [id number]  MEDICAL RECORD NO.:  [id number]  LOCATION:  A311                          FACILITY:  [facility]  PHYSICIAN:  Aleda Madl D. Felecia Shelling, MD   DATE OF BIRTH:  05-Sep-1928  DATE OF ADMISSION:  09/07/2011 DATE OF DISCHARGE:  LH                             HISTORY & PHYSICAL   CHIEF COMPLAINT:  Cough, shortness of breath, and wheezing.  HISTORY OF PRESENT ILLNESS:  This is an 77 year old male patient with history of multiple medical illnesses who came to the office with above complaints.  He is known to have chronic bronchitis.  He has history of previous tobacco use, apparently however he has stopped smoking.  The patient recently developed cough and wheezing and shortness of breath. No fever or chills.  He was evaluated in the office and the patient was found to have significant expiratory wheezes and rhonchi.  The patient was initially started on oral antibiotics and oral steroid.  Chest x-ray was requested, which showed new left perihilar pulmonary opacity, which was compatible with pneumonia.  The patient was then directly admitted for further treatment.  REVIEW OF SYSTEMS:  The patient has generalized weakness, recurrent cough with brownish sputum.  No fever or chills.  No chest pain, nausea, vomiting, abdominal pain, dysuria, urgency, or frequency of urination.  PAST MEDICAL HISTORY: 1. Hypertension. 2. Gastroesophageal reflux disease. 3. Hyperlipidemia. 4. Chronic bronchitis.  CURRENT MEDICATIONS: 1. Omeprazole 20 mg p.o. daily. 2. Lisinopril 20 mg daily. 3. Albuterol inhaler q.i.d. 4. Zithromax 250 mg daily. 5. Claritin 10 mg p.o. daily.  SOCIAL HISTORY:  The patient has stopped smoking tobacco.  No history of alcohol or substance abuse.  FAMILY HISTORY:  No known history of hypertension, diabetes, or cancer disease.  PHYSICAL EXAMINATION:  GENERAL:  The patient is alert, awake, chronically sick looking. VITAL SIGNS:   Blood pressure 156/69, pulse 69, respiratory rate 18, temperature 97.8. HEENT:  Pupils are equal and reactive. NECK:  Supple. CHEST:  Poor air entry, bilateral rhonchi, end-expiratory wheezes. CARDIOVASCULAR:  First and second heart sounds heard.  No murmur.  No gallop. ABDOMEN:  Soft and lax.  Bowel sound is positive.  No mass or organomegaly. EXTREMITIES:  No leg edema.  Chest x-ray showed new left perihilar opacity compatible with pneumonia. CT scan of the chest also showed patchy nodular opacity in the left upper and lower lobe compatible with multifocal pneumonia.  LABORATORY DATA:  CBC; WBC 10.8, hemoglobin 12.7, hematocrit 39.7, platelets 131.  BMP; sodium 133, potassium 3.9, chloride 98, carbon dioxide 25, blood glucose 323, BUN 9, creatinine 0.9.  ASSESSMENT: 1. Pneumonia. 2. Chronic obstructive pulmonary disease. 3. Hyperglycemia, probably steroid induced. 4. Hypertension. 5. Hyperlipidemia.  PLAN:  We will continue the patient on IV steroid and IV antibiotics. We will do on sliding scale insulin coverage.  We will start him on Lantus insulin.  We will do hemoglobin A1c.  Continue regular medications.     Sokha Craker D. Felecia Shelling, MD     TDF/MEDQ  D:  09/08/2011  T:  09/08/2011  Job:  [id number]

## 2011-09-08 NOTE — Op Note (Signed)
  NAMEORRIN, Hayden Green NO.:  [id number]  MEDICAL RECORD NO.:  [id number]  LOCATION:  SDSC                         FACILITY:  [facility]  PHYSICIAN:  Chalmers Guest, M.D.     DATE OF BIRTH:  [birth year]  DATE OF PROCEDURE:  08/23/2011 DATE OF DISCHARGE:  05/16/2011                              OPERATIVE REPORT   PREOPERATIVE DIAGNOSIS:  Visually significant cataract, right eye.  POSTOPERATIVE DIAGNOSIS:  Visually significant cataract, right eye.  PROCEDURE:  Phacoemulsification intraocular lens implant.  COMPLICATIONS:  None.  ANESTHESIA:  Xylocaine 2%, 50:50 mix of 0.75% Marcaine with ampule of Wydase.  PROCEDURE:  The patient was taken to the operating room, where under monitored anesthesia he was given a peribulbar block with the aforementioned local anesthetic agent.  Following this, with the surgeon sitting temporally the patient's face was prepped and draped in usual sterile fashion.  Operating microscope was in position, a Weck-cel was used to fixate the eye.  A 15-degree blade was used to enter through superior clear cornea.  Viscoat was injected.  Following this, a 2.75-mm keratome blade was used in a stepwise fashion through temporal clear cornea to enter the eye.  A bent 25-gauge needle was used to incise the anterior capsule and a continuous tear curvilinear capsulorrhexis was performed.  BSS was used to hydrodissect and hydrodelineate the nucleus. The phacoemulsification unit was then used to sculpt the nucleus and cracked the nucleus into 4 quadrants.  All nuclear fragments were removed.  The IA was then used to remove cortex and sub-incisional cortex was removed using the main course cannula to elevate the material.  After all nuclear material removed from the eye, the posterior capsule remained intact.  The intraocular lens implant was examined.  It was noted to have no defects.  It was an Alcon AcrySof lens.  SN 60 WF 21.5 diopter lens, SN  #[id number], the lens was placed in the lens injector and injected in the eye position with the Kuglin hook dye was used to remove viscoelastic.  Following this, the eye was pressurized.  There have been no leakage.  All instrumentation removed from the eye.  Topical TobraDex ointment was applied to the eye patch and Fox shield were placed and the patient returned to recovery area in stable condition.  Complications were none.     Chalmers Guest, M.D.     RW/MEDQ  D:  08/23/2011  T:  08/24/2011  Job:  [id number]  Electronically Signed by Chalmers Guest M.D. on 09/08/2011 11:09:17 AM

## 2011-09-09 LAB — GLUCOSE, CAPILLARY
Glucose-Capillary: 215 mg/dL — ABNORMAL HIGH (ref 70–99)
Glucose-Capillary: 304 mg/dL — ABNORMAL HIGH (ref 70–99)
Glucose-Capillary: 250 mg/dL — ABNORMAL HIGH (ref 70–99)

## 2011-09-09 MED ORDER — METHYLPREDNISOLONE SODIUM SUCC 125 MG IJ SOLR
60.0000 mg | Freq: Four times a day (QID) | INTRAMUSCULAR | Status: DC
  Administered 2011-09-09 – 2011-09-10 (×4): 60 mg via INTRAVENOUS
  Filled 2011-09-09 (×4): qty 2

## 2011-09-09 MED ORDER — INSULIN ASPART 100 UNIT/ML ~~LOC~~ SOLN
6.0000 [IU] | Freq: Three times a day (TID) | SUBCUTANEOUS | Status: DC
  Administered 2011-09-09 – 2011-09-10 (×6): 6 [IU] via SUBCUTANEOUS

## 2011-09-09 MED ORDER — SODIUM CHLORIDE 0.9 % IJ SOLN
INTRAMUSCULAR | Status: AC
  Filled 2011-09-09: qty 3

## 2011-09-09 MED ORDER — ENOXAPARIN SODIUM 40 MG/0.4ML ~~LOC~~ SOLN
40.0000 mg | SUBCUTANEOUS | Status: DC
  Administered 2011-09-09 – 2011-09-10 (×2): 40 mg via SUBCUTANEOUS
  Filled 2011-09-09 (×2): qty 0.4

## 2011-09-09 NOTE — Progress Notes (Unsigned)
NAME:  Hayden Green, Hayden Green NO.:  [id number]  MEDICAL RECORD NO.:  [id number]  LOCATION:                                 FACILITY:  PHYSICIAN:  Timmey Lamba D. Felecia Shelling, MD   DATE OF BIRTH:  1928/04/15  DATE OF PROCEDURE:  09/09/2011 DATE OF DISCHARGE:                                PROGRESS NOTE   SUBJECTIVE:  The patient feels better.  His cough and wheezing is improving.  He is started on insulin therapy for hyperglycemia.  OBJECTIVE:  GENERAL/VITAL SIGNS:  Patient is alert and awake and resting with vitals, blood pressure 130/76, pulse 70, respiratory rate 18, temperature 97.9 degrees F. CHEST:  Decreased air entry, bilateral rhonchi, and few scattered expiratory wheezes.  CARDIOVASCULAR SYSTEM:  First and second heart sounds heard.  No murmur.  No gallop.  ABDOMEN:  Soft, and lax.  Bowel sound is positive.  No mass or organomegaly. EXTREMITIES:  No leg edema.  LABS:  Hemoglobin A1c 6.4, blood glucose is between 350-400.  ASSESSMENT: 1. Pneumonia. 2. Chronic obstructive pulmonary disease. 3. Hyperglycemia due to steroid therapy. 4. Hypertension.  PLAN:  Continue patient on IV antibiotics.  We will continue Accu-Chek with a sliding scale.  Continue nebulizer treatment.  We will continue IV steroid.  We will continue supportive care.     Quantay Zaremba D. Felecia Shelling, MD     TDF/MEDQ  D:  09/09/2011  T:  09/09/2011  Job:  [id number]

## 2011-09-10 LAB — GLUCOSE, CAPILLARY: Glucose-Capillary: 223 mg/dL — ABNORMAL HIGH (ref 70–99)

## 2011-09-10 MED ORDER — PREDNISONE 10 MG PO TABS
10.0000 mg | ORAL_TABLET | Freq: Every day | ORAL | Status: DC
  Administered 2011-09-10 – 2011-09-11 (×2): 10 mg via ORAL
  Filled 2011-09-10 (×2): qty 1

## 2011-09-10 MED ORDER — SODIUM CHLORIDE 0.9 % IJ SOLN
INTRAMUSCULAR | Status: AC
  Filled 2011-09-10: qty 3

## 2011-09-10 MED ORDER — IPRATROPIUM BROMIDE 0.02 % IN SOLN
RESPIRATORY_TRACT | Status: AC
  Administered 2011-09-10: 0.5 mg via RESPIRATORY_TRACT
  Filled 2011-09-10: qty 2.5

## 2011-09-10 MED ORDER — ALBUTEROL SULFATE (5 MG/ML) 0.5% IN NEBU
INHALATION_SOLUTION | RESPIRATORY_TRACT | Status: AC
  Administered 2011-09-10: 2.5 mg via RESPIRATORY_TRACT
  Filled 2011-09-10: qty 0.5

## 2011-09-10 NOTE — Progress Notes (Signed)
NAME:  Hayden Green, Hayden Green NO.:  [id number]  MEDICAL RECORD NO.:  [id number]  LOCATION:  RAD                           FACILITY:  [facility]  PHYSICIAN:  Maylie Ashton D. Felecia Shelling, MD   DATE OF BIRTH:  07/13/1928  DATE OF PROCEDURE: DATE OF DISCHARGE:  09/07/2011                                PROGRESS NOTE   SUBJECTIVE:  The patient feels much better.  His cough, wheezing, and shortness of breath is much better.  No fever or chills.  OBJECTIVE:  GENERAL:  The patient is more alert, awake, and resting. VITAL SIGNS:  Blood pressure 134/70, pulse 67, respiratory rate 18, temperature 97.8 degrees Fahrenheit. CHEST:  Decreased air entry, few rhonchi. CARDIOVASCULAR SYSTEM:  First and second heart sounds heard.  No murmur. No gallop.  ABDOMEN:  Soft and lax.  Bowel sound is positive.  No mass or organomegaly. EXTREMITIES:  No leg edema.  LABORATORY DATA:  Blood glucose 258.  ASSESSMENT: 1. Community-acquired pneumonia, clinically improving. 2. Acute exacerbation of chronic obstructive pulmonary disease. 3. Steroid-induced hyperglycemia. 4. Hypertension. 5. Obesity.  PLAN:  Continue the patient on IV antibiotics.  We will continue to taper his IV steroid and we will continue nebulizer treatments.     Nikolas Casher D. Felecia Shelling, MD     TDF/MEDQ  D:  09/10/2011  T:  09/10/2011  Job:  [id number]

## 2011-09-11 MED ORDER — METFORMIN HCL 500 MG PO TABS: 500.0000 mg | ORAL_TABLET | Freq: Two times a day (BID) | ORAL | Status: AC

## 2011-09-11 MED ORDER — AMOXICILLIN-POT CLAVULANATE 500-125 MG PO TABS: 1.0000 | ORAL_TABLET | Freq: Three times a day (TID) | ORAL | Status: AC

## 2011-09-11 NOTE — Discharge Summary (Signed)
NAME:  Hayden Green, XU NO.:  [id number]  MEDICAL RECORD NO.:  [id number]  LOCATION:  A311                          FACILITY:  [facility]  PHYSICIAN:  Vergil Burby D. Felecia Shelling, MD   DATE OF BIRTH:  01-13-1928  DATE OF ADMISSION:  09/07/2011 DATE OF DISCHARGE:  11/06/2012LH                              DISCHARGE SUMMARY   DISCHARGE DIAGNOSES: 1. Pneumonia. 2. Chronic obstructive pulmonary disease. 3. Steroid-induced hyperglycemia. 4. Hypertension. 5. Hyperlipidemia. 6. Obesity.  DISCHARGE MEDICATIONS: 1. Atrovent and Proventil nebulizer q.6 h. p.r.n. 2. Lisinopril 20 mg daily. 3. Claritin 10 mg daily. 4. Prednisolone tapering dose. 5. Augmentin 500 mg p.o. t.i.d. for 5 more days. 6. Glucophage 500 mg p.o. b.i.d. 7. Travoprost 0.004% eye drops 1 drop in each eye at bedtime.  DISPOSITION:  The patient will be discharged to home in a stable condition.  DISCHARGE INSTRUCTIONS:  The patient will continue his current medications.  He will be followed in the office in 1-week duration.  LABS ON DISCHARGE:  Blood glucose 223.  Hemoglobin A1c 6.4.  WBC 10.3, hemoglobin 12.5, hematocrit 39.7, and platelets 131.  BMP; sodium 133, potassium 3.9, chloride 98, carbon dioxide 25.  Blood sugar on admission 323, BUN 9, creatinine 0.91, and calcium 9.9.  HOSPITAL COURSE:  This is an 77 year old male patient with history of hypertension, hyperlipidemia, and chronic bronchitis who was seen in the office due to cough and shortness of breath.  The patient was started on oral steroid and antibiotics and chest x-ray was ordered.  His chest x- ray showed new pneumonia.  The patient was then directly admitted and was treated with IV antibiotics.  Currently, he has improved.  During this hospital admission, he was found to have also hyperglycemia, which may be exacerbated by steroid.  He was started on insulin therapy.  He is being discharged with oral metformin.     Anjel Pardo D. Felecia Shelling,  MD     TDF/MEDQ  D:  09/11/2011  T:  09/11/2011  Job:  [id number]

## 2011-09-11 NOTE — Progress Notes (Signed)
Pt discharged home today per Dr. Felecia Shelling. Pt's VS stable at this time. Pt's IV site D/C'd and WNL. Pt provided with home medication list and discharge instructions. Pt verbalized understanding. Pt left floor via WC accompanied by NT in stable condition. Dagoberto Ligas, RN

## 2011-09-19 ENCOUNTER — Other Ambulatory Visit (HOSPITAL_COMMUNITY): Payer: Self-pay | Admitting: Internal Medicine

## 2011-09-19 ENCOUNTER — Ambulatory Visit (HOSPITAL_COMMUNITY)
Admission: RE | Admit: 2011-09-19 | Discharge: 2011-09-19 | Disposition: A | Discharge status: Home / Self Care | Payer: Medicare Other | Source: Ambulatory Visit | Attending: Internal Medicine | Admitting: Internal Medicine

## 2011-09-19 DIAGNOSIS — J189 Pneumonia, unspecified organism: Secondary | ICD-10-CM

## 2012-04-24 ENCOUNTER — Ambulatory Visit (HOSPITAL_COMMUNITY)
Admission: RE | Admit: 2012-04-24 | Discharge: 2012-04-24 | Disposition: A | Discharge status: Home / Self Care | Payer: Medicare Other | Source: Ambulatory Visit | Attending: Internal Medicine | Admitting: Internal Medicine

## 2012-04-24 ENCOUNTER — Other Ambulatory Visit (HOSPITAL_COMMUNITY): Payer: Self-pay | Admitting: Internal Medicine

## 2012-04-24 DIAGNOSIS — J449 Chronic obstructive pulmonary disease, unspecified: Secondary | ICD-10-CM

## 2012-04-24 DIAGNOSIS — J4489 Other specified chronic obstructive pulmonary disease: Secondary | ICD-10-CM | POA: Insufficient documentation

## 2013-03-17 ENCOUNTER — Ambulatory Visit (HOSPITAL_COMMUNITY)
Admission: RE | Admit: 2013-03-17 | Discharge: 2013-03-17 | Disposition: A | Discharge status: Home / Self Care | Payer: Medicare Other | Source: Ambulatory Visit | Attending: Internal Medicine | Admitting: Internal Medicine

## 2013-03-17 ENCOUNTER — Other Ambulatory Visit (HOSPITAL_COMMUNITY): Payer: Self-pay | Admitting: Internal Medicine

## 2013-03-17 DIAGNOSIS — R0789 Other chest pain: Secondary | ICD-10-CM

## 2013-03-17 DIAGNOSIS — R071 Chest pain on breathing: Secondary | ICD-10-CM | POA: Insufficient documentation

## 2013-03-22 ENCOUNTER — Encounter (HOSPITAL_COMMUNITY): Payer: Self-pay | Admitting: *Deleted

## 2013-03-22 ENCOUNTER — Emergency Department (HOSPITAL_COMMUNITY): Payer: Medicare Other

## 2013-03-22 ENCOUNTER — Emergency Department (HOSPITAL_COMMUNITY)
Admission: EM | Admit: 2013-03-22 | Discharge: 2013-03-23 | Disposition: A | Discharge status: Home / Self Care | Payer: Medicare Other | Attending: Emergency Medicine | Admitting: Emergency Medicine

## 2013-03-22 DIAGNOSIS — Z87891 Personal history of nicotine dependence: Secondary | ICD-10-CM | POA: Insufficient documentation

## 2013-03-22 DIAGNOSIS — R110 Nausea: Secondary | ICD-10-CM

## 2013-03-22 DIAGNOSIS — Z79899 Other long term (current) drug therapy: Secondary | ICD-10-CM | POA: Insufficient documentation

## 2013-03-22 DIAGNOSIS — Z8701 Personal history of pneumonia (recurrent): Secondary | ICD-10-CM | POA: Insufficient documentation

## 2013-03-22 DIAGNOSIS — J4489 Other specified chronic obstructive pulmonary disease: Secondary | ICD-10-CM | POA: Insufficient documentation

## 2013-03-22 DIAGNOSIS — J449 Chronic obstructive pulmonary disease, unspecified: Secondary | ICD-10-CM | POA: Insufficient documentation

## 2013-03-22 DIAGNOSIS — Z8739 Personal history of other diseases of the musculoskeletal system and connective tissue: Secondary | ICD-10-CM | POA: Insufficient documentation

## 2013-03-22 DIAGNOSIS — E119 Type 2 diabetes mellitus without complications: Secondary | ICD-10-CM | POA: Insufficient documentation

## 2013-03-22 DIAGNOSIS — K219 Gastro-esophageal reflux disease without esophagitis: Secondary | ICD-10-CM | POA: Insufficient documentation

## 2013-03-22 DIAGNOSIS — R531 Weakness: Secondary | ICD-10-CM

## 2013-03-22 DIAGNOSIS — R5381 Other malaise: Secondary | ICD-10-CM | POA: Insufficient documentation

## 2013-03-22 DIAGNOSIS — I10 Essential (primary) hypertension: Secondary | ICD-10-CM | POA: Insufficient documentation

## 2013-03-22 HISTORY — DX: Type 2 diabetes mellitus without complications: E11.9

## 2013-03-22 HISTORY — DX: Chronic obstructive pulmonary disease, unspecified: J44.9

## 2013-03-22 LAB — BASIC METABOLIC PANEL
Calcium: 10 mg/dL (ref 8.4–10.5)
GFR calc non Af Amer: 74 mL/min — ABNORMAL LOW (ref >90)
Glucose, Bld: 137 mg/dL — ABNORMAL HIGH (ref 70–99)
Sodium: 142 mEq/L (ref 135–145)

## 2013-03-22 LAB — CBC WITH DIFFERENTIAL/PLATELET
Eosinophils Absolute: 0.7 10*3/uL (ref 0.0–0.7)
Eosinophils Relative: 9 % — ABNORMAL HIGH (ref 0–5)
Lymphs Abs: 2.3 10*3/uL (ref 0.7–4.0)
MCH: 29.7 pg (ref 26.0–34.0)
MCHC: 33.7 g/dL (ref 30.0–36.0)
MCV: 88.2 fL (ref 78.0–100.0)
Platelets: 163 10*3/uL (ref 150–400)
RDW: 12.6 % (ref 11.5–15.5)

## 2013-03-22 NOTE — ED Notes (Signed)
Pt reports general weakness & nausea that started about 2100. Pt denies any fevers or vomiting.

## 2013-03-22 NOTE — ED Provider Notes (Signed)
History  This chart was scribed for [employer]. Colon Branch, MD by Bennett Scrape, ED Scribe. This patient was seen in room APA14/APA14 and the patient's care was started at 11:16 PM.  CSN: [id number]  Arrival date & time 03/22/13  2224   First MD Initiated Contact with Patient 03/22/13 2306      Chief Complaint  Patient presents with  . Weakness  . Nausea     The history is provided by the patient. No language interpreter was used.    HPI Comments: Hayden Green is a 79 y.o. male who presents to the Emergency Department complaining of gradual-onset nausea with associated weakness described as having no energy which began 2 hours ago this evening. Pt reports that he felt so weak that he did not feel like standing up. His wife checked his blood sugar was 77 when it was taken at home. After his blood sugar level was taken, his wife gave him orange juice because she thought his levels were low. Pt reports he has experienced a similar episode less than a month ago that improved without any intervention. He denies dizziness and nausea currently. Last normal BM was 10 hours ago. Five days ago, pt fell while gardening and injured his chest. Pt visited his PCP, and his PCP prescribed him pain medication, but pt states he has not taken the medication for the last two days stating he didn't want to. Pt has a history of DM, COPD, and HTN.   Pt's PCP is Dr. Felecia Shelling  Past Medical History  Diagnosis Date  . Pneumonia   . Hypertension   . GERD (gastroesophageal reflux disease)   . Arthritis   . Diabetes mellitus without complication   . COPD (chronic obstructive pulmonary disease)     Past Surgical History  Procedure Laterality Date  . Abdominal surgery      No family history on file.  History  Substance Use Topics  . Smoking status: Former Games developer  . Smokeless tobacco: Former Neurosurgeon  . Alcohol Use: No      Review of Systems  Constitutional: Negative for fever.       10 systems reviewed and are  negative for acute change except as noted in the HPI  HENT: Negative for congestion.   Eyes: Negative for discharge and redness.  Respiratory: Negative for cough and shortness of breath.   Cardiovascular: Negative for chest pain.  Gastrointestinal: Positive for nausea (now resolved ). Negative for vomiting and abdominal pain.  Musculoskeletal: Negative for back pain.  Skin: Negative for rash.  Neurological: Positive for weakness (now resolved). Negative for syncope, numbness and headaches.  Psychiatric/Behavioral:       No behavior change    Allergies  Reglan  Home Medications   Current Outpatient Rx  Name  Route  Sig  Dispense  Refill  . albuterol (PROVENTIL HFA;VENTOLIN HFA) 108 (90 BASE) MCG/ACT inhaler   Inhalation   Inhale 2 puffs into the lungs every 6 (six) hours as needed.           . brimonidine (ALPHAGAN P) 0.1 % SOLN   Both Eyes   Place 1 drop into both eyes 2 (two) times daily.         . Fluticasone-Salmeterol (ADVAIR) 250-50 MCG/DOSE AEPB   Inhalation   Inhale 1 puff into the lungs every 12 (twelve) hours.         Marland Kitchen HYDROcodone-acetaminophen (NORCO/VICODIN) 5-325 MG per tablet   Oral   Take 1 tablet by  mouth every 6 (six) hours as needed for pain.         Marland Kitchen lisinopril (PRINIVIL,ZESTRIL) 20 MG tablet   Oral   Take 20 mg by mouth daily.           Marland Kitchen loratadine (CLARITIN) 10 MG tablet   Oral   Take 10 mg by mouth daily as needed for allergies.          Marland Kitchen meclizine (ANTIVERT) 25 MG tablet   Oral   Take 25 mg by mouth 4 (four) times daily as needed for dizziness.         . metFORMIN (GLUCOPHAGE) 500 MG tablet   Oral   Take 1 tablet (500 mg total) by mouth 2 (two) times daily with a meal.   60 tablet   1   . omeprazole (PRILOSEC) 20 MG capsule   Oral   Take 20 mg by mouth daily as needed (for acid reflux).         . Travoprost, BAK Free, (TRAVATAMN) 0.004 % SOLN ophthalmic solution   Both Eyes   Place 1 drop into both eyes at bedtime.              Triage Vitals BP 177/70  Pulse 64  Temp(Src) 97.8 F (36.6 C) (Oral)  Resp 20  Ht 5\' 6"  (1.676 m)  Wt 196 lb (88.905 kg)  BMI 31.65 kg/m2  SpO2 99%  Physical Exam  Nursing note and vitals reviewed. Constitutional: He is oriented to person, place, and time. He appears well-developed and well-nourished. No distress.  HENT:  Head: Normocephalic and atraumatic.  Eyes: EOM are normal.  Neck: Neck supple. No tracheal deviation present.  Cardiovascular: Normal rate, regular rhythm and normal heart sounds.   Pulmonary/Chest: Effort normal and breath sounds normal. No respiratory distress.  Musculoskeletal: Normal range of motion.  Neurological: He is alert and oriented to person, place, and time.  Skin: Skin is warm and dry.  Psychiatric: He has a normal mood and affect. His behavior is normal.    ED Course  Procedures (including critical care time) Results for orders placed during the hospital encounter of 03/22/13  CBC WITH DIFFERENTIAL      Result Value Range   WBC 7.1  4.0 - 10.5 K/uL   RBC 4.34  4.22 - 5.81 MIL/uL   Hemoglobin 12.9 (*) 13.0 - 17.0 g/dL   HCT 16.1 (*) 09.6 - 04.5 %   MCV 88.2  78.0 - 100.0 fL   MCH 29.7  26.0 - 34.0 pg   MCHC 33.7  30.0 - 36.0 g/dL   RDW 40.9  81.1 - 91.4 %   Platelets 163  150 - 400 K/uL   Neutrophils Relative % 51  43 - 77 %   Neutro Abs 3.7  1.7 - 7.7 K/uL   Lymphocytes Relative 32  12 - 46 %   Lymphs Abs 2.3  0.7 - 4.0 K/uL   Monocytes Relative 7  3 - 12 %   Monocytes Absolute 0.5  0.1 - 1.0 K/uL   Eosinophils Relative 9 (*) 0 - 5 %   Eosinophils Absolute 0.7  0.0 - 0.7 K/uL   Basophils Relative 0  0 - 1 %   Basophils Absolute 0.0  0.0 - 0.1 K/uL  BASIC METABOLIC PANEL      Result Value Range   Sodium 142  135 - 145 mEq/L   Potassium 4.0  3.5 - 5.1 mEq/L   Chloride 105  96 -  112 mEq/L   CO2 28  19 - 32 mEq/L   Glucose, Bld 137 (*) 70 - 99 mg/dL   BUN 13  6 - 23 mg/dL   Creatinine, Ser 4.09  0.50 - 1.35 mg/dL    Calcium 81.1  8.4 - 10.5 mg/dL   GFR calc non Af Amer 74 (*) >90 mL/min   GFR calc Af Amer 86 (*) >90 mL/min   DIAGNOSTIC STUDIES: Oxygen Saturation is 99% on room air, normal by my interpretation.    COORDINATION OF CARE:  11:20 PM- Discussed results of labs and x-ray with the pt. Advised pt how best to deal with his blood sugar levels. Informed pt that the pain associated with his fall should improve in the next several days. Will prescribe pt an anti-nausea prescription to take as needed. Pt agreed with the treatment plan.    Dg Chest Portable 1 View  03/22/2013   *RADIOLOGY REPORT*  Clinical Data: Weakness.  Nausea.  PORTABLE CHEST - 1 VIEW  Comparison: 03/17/2013  Findings: Heart size remains at the upper limits of normal.  Mild tortuosity thoracic aorta is stable.  Both lungs are clear.  No evidence of pleural effusion.  No mass or lymphadenopathy identified.  IMPRESSION: No active cardiopulmonary disease.   Original Report Authenticated By: Myles Rosenthal, M.D.        MDM  Patient with sudden onset weakness and nausea at home. Blood sugar was 77. He was given orange juice and is now better. Labs are normal, chest xray is normal. Reviewed results with the patient, his wife and daughter. Pt stable in ED with no significant deterioration in condition.The patient appears reasonably screened and/or stabilized for discharge and I doubt any other medical condition or other [facility] requiring further screening, evaluation, or treatment in the ED at this time prior to discharge.  I personally performed the services described in this documentation, which was scribed in my presence. The recorded information has been reviewed and considered.  MDM Reviewed: nursing note and vitals Interpretation: labs and x-ray          Nicoletta Dress. Colon Branch, MD 03/23/13 [phone]

## 2013-03-23 NOTE — ED Notes (Signed)
Pt alert & oriented x4, stable gait. Patient given discharge instructions, paperwork & prescription(s). Patient  instructed to stop at the registration desk to finish any additional paperwork. Patient verbalized understanding. Pt left department w/ no further questions. 

## 2014-12-27 DIAGNOSIS — E119 Type 2 diabetes mellitus without complications: Secondary | ICD-10-CM | POA: Diagnosis not present

## 2014-12-27 DIAGNOSIS — M1990 Unspecified osteoarthritis, unspecified site: Secondary | ICD-10-CM | POA: Diagnosis not present

## 2014-12-27 DIAGNOSIS — J449 Chronic obstructive pulmonary disease, unspecified: Secondary | ICD-10-CM | POA: Diagnosis not present

## 2014-12-27 DIAGNOSIS — I10 Essential (primary) hypertension: Secondary | ICD-10-CM | POA: Diagnosis not present

## 2015-02-21 DIAGNOSIS — E119 Type 2 diabetes mellitus without complications: Secondary | ICD-10-CM | POA: Diagnosis not present

## 2015-02-21 DIAGNOSIS — E1165 Type 2 diabetes mellitus with hyperglycemia: Secondary | ICD-10-CM | POA: Diagnosis not present

## 2015-02-21 DIAGNOSIS — J449 Chronic obstructive pulmonary disease, unspecified: Secondary | ICD-10-CM | POA: Diagnosis not present

## 2015-02-21 DIAGNOSIS — E785 Hyperlipidemia, unspecified: Secondary | ICD-10-CM | POA: Diagnosis not present

## 2015-02-21 DIAGNOSIS — I10 Essential (primary) hypertension: Secondary | ICD-10-CM | POA: Diagnosis not present

## 2015-04-05 DIAGNOSIS — H35033 Hypertensive retinopathy, bilateral: Secondary | ICD-10-CM | POA: Diagnosis not present

## 2015-04-05 DIAGNOSIS — E119 Type 2 diabetes mellitus without complications: Secondary | ICD-10-CM | POA: Diagnosis not present

## 2015-04-05 DIAGNOSIS — H31011 Macula scars of posterior pole (postinflammatory) (post-traumatic), right eye: Secondary | ICD-10-CM | POA: Diagnosis not present

## 2015-04-05 DIAGNOSIS — H4011X2 Primary open-angle glaucoma, moderate stage: Secondary | ICD-10-CM | POA: Diagnosis not present

## 2015-05-23 DIAGNOSIS — J449 Chronic obstructive pulmonary disease, unspecified: Secondary | ICD-10-CM | POA: Diagnosis not present

## 2015-05-23 DIAGNOSIS — M1990 Unspecified osteoarthritis, unspecified site: Secondary | ICD-10-CM | POA: Diagnosis not present

## 2015-05-23 DIAGNOSIS — I10 Essential (primary) hypertension: Secondary | ICD-10-CM | POA: Diagnosis not present

## 2015-05-23 DIAGNOSIS — E119 Type 2 diabetes mellitus without complications: Secondary | ICD-10-CM | POA: Diagnosis not present

## 2015-06-08 DIAGNOSIS — H4011X2 Primary open-angle glaucoma, moderate stage: Secondary | ICD-10-CM | POA: Diagnosis not present

## 2015-07-12 DIAGNOSIS — H4011X2 Primary open-angle glaucoma, moderate stage: Secondary | ICD-10-CM | POA: Diagnosis not present

## 2015-08-22 DIAGNOSIS — J449 Chronic obstructive pulmonary disease, unspecified: Secondary | ICD-10-CM | POA: Diagnosis not present

## 2015-08-22 DIAGNOSIS — E1165 Type 2 diabetes mellitus with hyperglycemia: Secondary | ICD-10-CM | POA: Diagnosis not present

## 2015-08-22 DIAGNOSIS — Z23 Encounter for immunization: Secondary | ICD-10-CM | POA: Diagnosis not present

## 2015-08-22 DIAGNOSIS — I10 Essential (primary) hypertension: Secondary | ICD-10-CM | POA: Diagnosis not present

## 2015-11-03 DIAGNOSIS — J441 Chronic obstructive pulmonary disease with (acute) exacerbation: Secondary | ICD-10-CM | POA: Diagnosis not present

## 2015-11-03 DIAGNOSIS — E119 Type 2 diabetes mellitus without complications: Secondary | ICD-10-CM | POA: Diagnosis not present

## 2016-09-24 ENCOUNTER — Ambulatory Visit (HOSPITAL_COMMUNITY)
Admission: RE | Admit: 2016-09-24 | Discharge: 2016-09-24 | Disposition: A | Discharge status: Home / Self Care | Payer: Medicare Other | Source: Ambulatory Visit | Attending: Internal Medicine | Admitting: Internal Medicine

## 2016-09-24 ENCOUNTER — Other Ambulatory Visit (HOSPITAL_COMMUNITY): Payer: Self-pay | Admitting: Internal Medicine

## 2016-09-24 DIAGNOSIS — R0789 Other chest pain: Secondary | ICD-10-CM

## 2016-11-06 DIAGNOSIS — L0291 Cutaneous abscess, unspecified: Secondary | ICD-10-CM | POA: Diagnosis not present

## 2016-11-06 DIAGNOSIS — E1165 Type 2 diabetes mellitus with hyperglycemia: Secondary | ICD-10-CM | POA: Diagnosis not present

## 2016-11-06 DIAGNOSIS — J449 Chronic obstructive pulmonary disease, unspecified: Secondary | ICD-10-CM | POA: Diagnosis not present

## 2017-02-05 DIAGNOSIS — E1165 Type 2 diabetes mellitus with hyperglycemia: Secondary | ICD-10-CM | POA: Diagnosis not present

## 2017-02-05 DIAGNOSIS — E785 Hyperlipidemia, unspecified: Secondary | ICD-10-CM | POA: Diagnosis not present

## 2017-02-05 DIAGNOSIS — J449 Chronic obstructive pulmonary disease, unspecified: Secondary | ICD-10-CM | POA: Diagnosis not present

## 2017-02-05 DIAGNOSIS — Z0000 Encounter for general adult medical examination without abnormal findings: Secondary | ICD-10-CM | POA: Diagnosis not present

## 2017-02-05 DIAGNOSIS — Z1389 Encounter for screening for other disorder: Secondary | ICD-10-CM | POA: Diagnosis not present

## 2017-02-05 DIAGNOSIS — I10 Essential (primary) hypertension: Secondary | ICD-10-CM | POA: Diagnosis not present

## 2017-02-12 DIAGNOSIS — H04123 Dry eye syndrome of bilateral lacrimal glands: Secondary | ICD-10-CM | POA: Diagnosis not present

## 2017-02-12 DIAGNOSIS — H401132 Primary open-angle glaucoma, bilateral, moderate stage: Secondary | ICD-10-CM | POA: Diagnosis not present

## 2017-05-13 DIAGNOSIS — R1319 Other dysphagia: Secondary | ICD-10-CM | POA: Diagnosis not present

## 2017-05-20 ENCOUNTER — Encounter: Payer: Self-pay | Admitting: Internal Medicine

## 2017-07-12 ENCOUNTER — Other Ambulatory Visit: Payer: Self-pay

## 2017-07-12 ENCOUNTER — Encounter: Payer: Self-pay | Admitting: Gastroenterology

## 2017-07-12 ENCOUNTER — Ambulatory Visit (INDEPENDENT_AMBULATORY_CARE_PROVIDER_SITE_OTHER): Payer: Medicare Other | Admitting: Gastroenterology

## 2017-07-12 DIAGNOSIS — R1310 Dysphagia, unspecified: Secondary | ICD-10-CM | POA: Diagnosis not present

## 2017-07-12 NOTE — Progress Notes (Signed)
Primary Care Physician:  Rosita Fire, MD Primary Gastroenterologist:  Dr. Gala Romney   Chief Complaint  Patient presents with  . Dysphagia    HPI:   Hayden Green is a 83 y.o. male presenting today at the request of his PCP secondary to dysphagia.    Has had solid food dysphagia off and on for several years. States at first he didn't pay attention to it. However, Sunday he was swallowing water and it wouldn't go down. Solid food dysphagia noted as well. States he is just noticing these symptoms more often lately. Tries to chew food well, but sometimes it does not help. No odynophagia. Has chronic GERD. Takes Prilosec daily now, but historically had only taken prn. Noted some improvement with dysphagia taking daily. Will rarely take an Aleve. No unexplained weight loss. Appetite is good. Trying not to gain weight.   Last colonoscopy 10-15 years ago at [facility]. Believes it was by Dr. Tamala Julian. No prior EGD. No concerning lower GI symptoms.    Past Medical History:  Diagnosis Date  . Arthritis   . COPD (chronic obstructive pulmonary disease) ([place])   . Diabetes mellitus without complication ([place])   . GERD (gastroesophageal reflux disease)   . Hypertension   . Pneumonia   . Renal cell carcinoma [facility])     Past Surgical History:  Procedure Laterality Date  . CHOLECYSTECTOMY    . right nephrectomy      Current Outpatient Prescriptions  Medication Sig Dispense Refill  . albuterol (PROVENTIL HFA;VENTOLIN HFA) 108 (90 BASE) MCG/ACT inhaler Inhale 2 puffs into the lungs every 6 (six) hours as needed.      . brimonidine (ALPHAGAN P) 0.1 % SOLN Place 1 drop into both eyes 2 (two) times daily.    . Fluticasone-Salmeterol (ADVAIR) 250-50 MCG/DOSE AEPB Inhale 1 puff into the lungs every 12 (twelve) hours.    Marland Kitchen lisinopril (PRINIVIL,ZESTRIL) 20 MG tablet Take 20 mg by mouth daily.      . meclizine (ANTIVERT) 25 MG tablet Take 25 mg by mouth 4 (four) times daily as needed for dizziness.    .  metFORMIN (GLUCOPHAGE) 500 MG tablet Take 1 tablet (500 mg total) by mouth 2 (two) times daily with a meal. 60 tablet 1  . omeprazole (PRILOSEC) 20 MG capsule Take 20 mg by mouth daily as needed (for acid reflux).    . Travoprost, BAK Free, (TRAVATAMN) 0.004 % SOLN ophthalmic solution Place 1 drop into both eyes at bedtime.       No current facility-administered medications for this visit.     Allergies as of 07/12/2017 - Review Complete 07/12/2017  Allergen Reaction Noted  . Reglan [metoclopramide] Swelling and Other (See Comments) 03/22/2013    Family History  Problem Relation Age of Onset  . Colon cancer Neg Hx   . Colon polyps Neg Hx     Social History   Social History  . Marital status: Married    Spouse name: N/A  . Number of children: N/A  . 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 Smoking status: Former Research scientist (life sciences)  . Smokeless tobacco: Former Systems developer  . Alcohol use No  . Drug use: No  . Sexual activity: Not on file   Other Topics Concern  . Not on file   Social History Narrative  . No narrative on file    Review of  Systems: Gen: Denies any fever, chills, fatigue, weight loss, lack of appetite.  CV: Denies chest pain, heart palpitations, peripheral edema, syncope.  Resp: Denies shortness of breath at rest or with exertion. Denies wheezing or cough.  GI: see HPI  GU : Denies urinary burning, urinary frequency, urinary hesitancy MS: Denies joint pain, muscle weakness, cramps, or limitation of movement.  Derm: Denies rash, itching, dry skin Psych: Denies depression, anxiety, memory loss, and confusion Heme: Denies bruising, bleeding, and enlarged lymph nodes.  Physical Exam: BP 140/79   Pulse 68   Temp 98.2 F (36.8 C) (Oral)   Ht 5\' 6"  (1.676 m)   Wt 189 lb 9.6 oz (86 kg)   BMI 30.60 kg/m  General:   Alert and oriented. Pleasant and cooperative. Well-nourished and well-developed.  Appears younger than stated age.  Head:  Normocephalic and atraumatic. Eyes:  Without icterus, sclera clear and conjunctiva pink.  Ears:  Normal auditory acuity. Nose:  No deformity, discharge,  or lesions. Mouth:  No deformity or lesions, oral mucosa pink.  Lungs:  Clear to auscultation bilaterally. No wheezes, rales, or rhonchi. No distress.  Heart:  S1, S2 present without murmurs appreciated.  Abdomen:  +BS, soft, non-tender and non-distended. No HSM noted. No guarding or rebound. No masses appreciated.  Rectal:  Deferred  Msk:  Mild kyphosis  Extremities:  Without edema. Neurologic:  Alert and  oriented x4;  grossly normal neurologically. Psych:  Alert and cooperative. Normal mood and affect.

## 2017-07-12 NOTE — Patient Instructions (Signed)
Continue Prilosec once daily.   We have set you up for an upper endoscopy with dilation in the near future.

## 2017-07-12 NOTE — Assessment & Plan Note (Signed)
83 year old male with several year history of dysphagia now worsening, although he has noted some mild improvement with taking Prilosec daily instead of prn. No weight loss, abdominal pain, or other alarm features. No prior EGD per his report. Doubt malignancy. Likely related to esophagitis, web, ring, stricture. Unable to exclude age-related motility disorder.  Proceed with upper endoscopy/dilation in the near future with Dr. Gala Romney. The risks, benefits, and alternatives have been discussed in detail with patient. They have stated understanding and desire to proceed.  Continue Prilosec once daily.

## 2017-07-12 NOTE — Patient Instructions (Signed)
PA info for EGD/DIL submitted via [facility] website. No PA needed. Decision ID# [id number].

## 2017-07-15 NOTE — Progress Notes (Signed)
cc'ed to pcp °

## 2017-07-16 DIAGNOSIS — H401132 Primary open-angle glaucoma, bilateral, moderate stage: Secondary | ICD-10-CM | POA: Diagnosis not present

## 2017-07-26 DIAGNOSIS — H401132 Primary open-angle glaucoma, bilateral, moderate stage: Secondary | ICD-10-CM | POA: Diagnosis not present

## 2017-08-07 ENCOUNTER — Encounter (HOSPITAL_COMMUNITY): Payer: Self-pay | Admitting: *Deleted

## 2017-08-07 ENCOUNTER — Encounter (HOSPITAL_COMMUNITY)
Admission: RE | Disposition: A | Discharge status: Home / Self Care | Payer: Self-pay | Source: Ambulatory Visit | Attending: Internal Medicine

## 2017-08-07 ENCOUNTER — Ambulatory Visit (HOSPITAL_COMMUNITY)
Admission: RE | Admit: 2017-08-07 | Discharge: 2017-08-07 | Disposition: A | Discharge status: Home / Self Care | Payer: Medicare Other | Source: Ambulatory Visit | Attending: Internal Medicine | Admitting: Internal Medicine

## 2017-08-07 DIAGNOSIS — E119 Type 2 diabetes mellitus without complications: Secondary | ICD-10-CM | POA: Insufficient documentation

## 2017-08-07 DIAGNOSIS — Q394 Esophageal web: Secondary | ICD-10-CM | POA: Diagnosis not present

## 2017-08-07 DIAGNOSIS — K219 Gastro-esophageal reflux disease without esophagitis: Secondary | ICD-10-CM | POA: Diagnosis not present

## 2017-08-07 DIAGNOSIS — Z7984 Long term (current) use of oral hypoglycemic drugs: Secondary | ICD-10-CM | POA: Diagnosis not present

## 2017-08-07 DIAGNOSIS — Z87891 Personal history of nicotine dependence: Secondary | ICD-10-CM | POA: Diagnosis not present

## 2017-08-07 DIAGNOSIS — I10 Essential (primary) hypertension: Secondary | ICD-10-CM | POA: Diagnosis not present

## 2017-08-07 DIAGNOSIS — Z888 Allergy status to other drugs, medicaments and biological substances status: Secondary | ICD-10-CM | POA: Diagnosis not present

## 2017-08-07 DIAGNOSIS — K449 Diaphragmatic hernia without obstruction or gangrene: Secondary | ICD-10-CM | POA: Insufficient documentation

## 2017-08-07 DIAGNOSIS — R1310 Dysphagia, unspecified: Secondary | ICD-10-CM | POA: Diagnosis not present

## 2017-08-07 DIAGNOSIS — K222 Esophageal obstruction: Secondary | ICD-10-CM | POA: Diagnosis not present

## 2017-08-07 DIAGNOSIS — J449 Chronic obstructive pulmonary disease, unspecified: Secondary | ICD-10-CM | POA: Insufficient documentation

## 2017-08-07 DIAGNOSIS — Z79899 Other long term (current) drug therapy: Secondary | ICD-10-CM | POA: Insufficient documentation

## 2017-08-07 HISTORY — PX: ESOPHAGOGASTRODUODENOSCOPY: SHX5428

## 2017-08-07 HISTORY — PX: MALONEY DILATION: SHX5535

## 2017-08-07 LAB — GLUCOSE, CAPILLARY: GLUCOSE-CAPILLARY: 105 mg/dL — AB (ref 65–99)

## 2017-08-07 SURGERY — EGD (ESOPHAGOGASTRODUODENOSCOPY)
Anesthesia: Moderate Sedation

## 2017-08-07 MED ORDER — MEPERIDINE HCL 100 MG/ML IJ SOLN
INTRAMUSCULAR | Status: DC | PRN
  Administered 2017-08-07: 25 mg via INTRAVENOUS

## 2017-08-07 MED ORDER — STERILE WATER FOR IRRIGATION IR SOLN
Status: DC | PRN
  Administered 2017-08-07: 09:00:00

## 2017-08-07 MED ORDER — SODIUM CHLORIDE 0.9 % IV SOLN
INTRAVENOUS | Status: DC
  Administered 2017-08-07: 08:00:00 via INTRAVENOUS

## 2017-08-07 MED ORDER — ONDANSETRON HCL 4 MG/2ML IJ SOLN
INTRAMUSCULAR | Status: AC
  Filled 2017-08-07: qty 2

## 2017-08-07 MED ORDER — LIDOCAINE VISCOUS 2 % MT SOLN
OROMUCOSAL | Status: AC
  Filled 2017-08-07: qty 15

## 2017-08-07 MED ORDER — MIDAZOLAM HCL 5 MG/5ML IJ SOLN
INTRAMUSCULAR | Status: AC
  Filled 2017-08-07: qty 10

## 2017-08-07 MED ORDER — MIDAZOLAM HCL 5 MG/5ML IJ SOLN
INTRAMUSCULAR | Status: DC | PRN
  Administered 2017-08-07: 2 mg via INTRAVENOUS
  Administered 2017-08-07 (×2): 0.5 mg via INTRAVENOUS

## 2017-08-07 MED ORDER — ONDANSETRON HCL 4 MG/2ML IJ SOLN
INTRAMUSCULAR | Status: DC | PRN
  Administered 2017-08-07: 4 mg via INTRAVENOUS

## 2017-08-07 MED ORDER — MEPERIDINE HCL 100 MG/ML IJ SOLN
INTRAMUSCULAR | Status: AC
  Filled 2017-08-07: qty 2

## 2017-08-07 MED ORDER — LIDOCAINE VISCOUS 2 % MT SOLN
OROMUCOSAL | Status: DC | PRN
  Administered 2017-08-07: 4 mL via OROMUCOSAL

## 2017-08-07 NOTE — Op Note (Signed)
[facility] Patient Name: Hayden Green Procedure Date: 08/07/2017 8:18 AM MRN: [id number] Date of Birth: [birth year] Attending MD: Norvel Richards , MD CSN: [id number] Age: 83 Admit Type: Outpatient Procedure:                Upper GI endoscopy Indications:              Dysphagia Providers:                Norvel Richards, MD, Jeanann Lewandowsky. Sharon Seller, RN,                            Randa Spike, Technician Referring MD:              Medicines:                Midazolam 3 mg IV, Meperidine 25 mg IV Complications:            No immediate complications. Estimated Blood Loss:     Estimated blood loss was minimal. Procedure:                Pre-Anesthesia Assessment:                           - Prior to the procedure, a History and Physical                            was performed, and patient medications and                            allergies were reviewed. The patient's tolerance of                            previous anesthesia was also reviewed. The risks                            and benefits of the procedure and the sedation                            options and risks were discussed with the patient.                            All questions were answered, and informed consent                            was obtained. ASA Grade Assessment: II - A patient                            with mild systemic disease. After reviewing the                            risks and benefits, the patient was deemed in                            satisfactory condition to undergo the procedure.  After obtaining informed consent, the endoscope was                            passed under direct vision. Throughout the                            procedure, the patient's blood pressure, pulse, and                            oxygen saturations were monitored continuously. The                            EG-[id number] ([id number]) scope was introduced through the   mouth, and advanced to the second part of duodenum.                            The upper GI endoscopy was accomplished without                            difficulty. The patient tolerated the procedure                            well. Scope In: 8:46:52 AM Scope Out: 9:00:00 AM Total Procedure Duration: 0 hours 13 minutes 8 seconds  Findings:      A web was found in the proximal esophagus.      A moderate Schatzki ring (acquired) was found at the gastroesophageal       junction. The scope was withdrawn. Dilation was performed with a Maloney       dilator with no resistance at 52 Fr. The scope was withdrawn. Dilation       was performed with a Maloney dilator with mild resistance at 64 Fr. The       dilation site was examined and showed moderate improvement in luminal       narrowing. Estimated blood loss was minimal. Schatzki's ring persisted.       Three-quarter "bites" taken with biopsy forceps addition to disrupted.      A small hiatal hernia was present.      The exam was otherwise without abnormality.      The duodenal bulb and second portion of the duodenum were normal. Impression:               - Web in the proximal esophagus. Dilated                           - Moderate Schatzki ring. Dilated.                           - Small hiatal hernia.                           - The examination was otherwise normal.                           - Normal duodenal bulb and second portion of the  duodenum.                           - No specimens collected. Moderate Sedation:      Moderate (conscious) sedation was administered by the endoscopy nurse       and supervised by the endoscopist. The following parameters were       monitored: oxygen saturation, heart rate, blood pressure, respiratory       rate, EKG, adequacy of pulmonary ventilation, and response to care.       Total physician intraservice time was 18 minutes. Recommendation:           - Patient has a  contact number available for                            emergencies. The signs and symptoms of potential                            delayed complications were discussed with the                            patient. Return to normal activities tomorrow.                            Written discharge instructions were provided to the                            patient.                           - Resume previous diet.                           - Continue present medications. Continue omeprazole                            20 mg daily                           - No repeat upper endoscopy.                           - Return to GI office in 6 months. Procedure Code(s):        --- Professional ---                           [phone], Esophagogastroduodenoscopy, flexible,                            transoral; diagnostic, including collection of                            specimen(s) by brushing or washing, when performed                            (separate procedure)  52841, Dilation of esophagus, by unguided sound or                            bougie, single or multiple passes                           99152, Moderate sedation services provided by the                            same physician or other qualified health care                            professional performing the diagnostic or                            therapeutic service that the sedation supports,                            requiring the presence of an independent trained                            observer to assist in the monitoring of the                            patient's level of consciousness and physiological                            status; initial 15 minutes of intraservice time,                            patient age 20 years or older Diagnosis Code(s):        --- Professional ---                           Q39.4, Esophageal web                           K22.2, Esophageal obstruction                            K44.9, Diaphragmatic hernia without obstruction or                            gangrene                           R13.10, Dysphagia, unspecified CPT copyright 2016 American Medical Association. All rights reserved. The codes documented in this report are preliminary and upon coder review may  be revised to meet current compliance requirements. Cristopher Estimable. Alexya Mcdaris, MD Norvel Richards, MD 08/07/2017 9:10:30 AM This report has been signed electronically. Number of Addenda: 0

## 2017-08-07 NOTE — Discharge Instructions (Signed)
Colonoscopy Discharge Instructions  Read the instructions outlined below and refer to this sheet in the next few weeks. These discharge instructions provide you with general information on caring for yourself after you leave the hospital. Your doctor may also give you specific instructions. While your treatment has been planned according to the most current medical practices available, unavoidable complications occasionally occur. If you have any problems or questions after discharge, call Dr. Gala Romney at [phone]. ACTIVITY  You may resume your regular activity, but move at a slower pace for the next 24 hours.   Take frequent rest periods for the next 24 hours.   Walking will help get rid of the air and reduce the bloated feeling in your belly (abdomen).   No driving for 24 hours (because of the medicine (anesthesia) used during the test).    Do not sign any important legal documents or operate any machinery for 24 hours (because of the anesthesia used during the test).  NUTRITION  Drink plenty of fluids.   You may resume your normal diet as instructed by your doctor.   Begin with a light meal and progress to your normal diet. Heavy or fried foods are harder to digest and may make you feel sick to your stomach (nauseated).   Avoid alcoholic beverages for 24 hours or as instructed.  MEDICATIONS  You may resume your normal medications unless your doctor tells you otherwise.  WHAT YOU CAN EXPECT TODAY  Some feelings of bloating in the abdomen.   Passage of more gas than usual.   Spotting of blood in your stool or on the toilet paper.  IF YOU HAD POLYPS REMOVED DURING THE COLONOSCOPY:  No aspirin products for 7 days or as instructed.   No alcohol for 7 days or as instructed.   Eat a soft diet for the next 24 hours.  FINDING OUT THE RESULTS OF YOUR TEST Not all test results are available during your visit. If your test results are not back during the visit, make an appointment  with your caregiver to find out the results. Do not assume everything is normal if you have not heard from your caregiver or the medical facility. It is important for you to follow up on all of your test results.  SEEK IMMEDIATE MEDICAL ATTENTION IF:  You have more than a spotting of blood in your stool.   Your belly is swollen (abdominal distention).   You are nauseated or vomiting.   You have a temperature over 101.   You have abdominal pain or discomfort that is severe or gets worse throughout the day.    GERD information provided  Continue Prilosec 20 mg daily  Office visit with [place] in 6 months     Gastroesophageal Reflux Disease, Adult Normally, food travels down the esophagus and stays in the stomach to be digested. If a person has gastroesophageal reflux disease (GERD), food and stomach acid move back up into the esophagus. When this happens, the esophagus becomes sore and swollen (inflamed). Over time, GERD can make small holes (ulcers) in the lining of the esophagus. Follow these instructions at home: Diet  Follow a diet as told by your doctor. You may need to avoid foods and drinks such as: ? Coffee and tea (with or without caffeine). ? Drinks that contain alcohol. ? Energy drinks and sports drinks. ? Carbonated drinks or sodas. ? Chocolate and cocoa. ? Peppermint and mint flavorings. ? Garlic and onions. ? Horseradish. ? Spicy and acidic foods,  such as peppers, chili powder, curry powder, vinegar, hot sauces, and BBQ sauce. ? Citrus fruit juices and citrus fruits, such as oranges, lemons, and limes. ? Tomato-based foods, such as red sauce, chili, salsa, and pizza with red sauce. ? Fried and fatty foods, such as donuts, french fries, potato chips, and high-fat dressings. ? High-fat meats, such as hot dogs, rib eye steak, sausage, ham, and bacon. ? High-fat dairy items, such as whole milk, butter, and cream cheese.  Eat small meals often. Avoid eating large  meals.  Avoid drinking large amounts of liquid with your meals.  Avoid eating meals during the 2-3 hours before bedtime.  Avoid lying down right after you eat.  Do not exercise right after you eat. General instructions  Pay attention to any changes in your symptoms.  Take over-the-counter and prescription medicines only as told by your doctor. Do not take aspirin, ibuprofen, or other NSAIDs unless your doctor says it is okay.  Do not use any tobacco products, including cigarettes, chewing tobacco, and e-cigarettes. If you need help quitting, ask your doctor.  Wear loose clothes. Do not wear anything tight around your waist.  Raise (elevate) the head of your bed about 6 inches (15 cm).  Try to lower your stress. If you need help doing this, ask your doctor.  If you are overweight, lose an amount of weight that is healthy for you. Ask your doctor about a safe weight loss goal.  Keep all follow-up visits as told by your doctor. This is important. Contact a doctor if:  You have new symptoms.  You lose weight and you do not know why it is happening.  You have trouble swallowing, or it hurts to swallow.  You have wheezing or a cough that keeps happening.  Your symptoms do not get better with treatment.  You have a hoarse voice. Get help right away if:  You have pain in your arms, neck, jaw, teeth, or back.  You feel sweaty, dizzy, or light-headed.  You have chest pain or shortness of breath.  You throw up (vomit) and your throw up looks like blood or coffee grounds.  You pass out (faint).  Your poop (stool) is bloody or black.  You cannot swallow, drink, or eat. This information is not intended to replace advice given to you by your health care provider. Make sure you discuss any questions you have with your health care provider. Document Released: 04/09/2008 Document Revised: 03/29/2016 Document Reviewed: 02/16/2015 Elsevier Interactive Patient Education  [employer].

## 2017-08-07 NOTE — Interval H&P Note (Signed)
History and Physical Interval Note:  08/07/2017 8:37 AM  Hayden Green  has presented today for surgery, with the diagnosis of dysphagia  The various methods of treatment have been discussed with the patient and family. After consideration of risks, benefits and other options for treatment, the patient has consented to  Procedure(s) with comments: ESOPHAGOGASTRODUODENOSCOPY (EGD) (N/A) - 8:45am MALONEY DILATION (N/A) as a surgical intervention .  The patient's history has been reviewed, patient examined, no change in status, stable for surgery.  I have reviewed the patient's chart and labs.  Questions were answered to the patient's satisfaction.     Robert Rourk  No change. EGD with ED feasible /appropriate.  The risks, benefits, limitations, alternatives and imponderables have been reviewed with the patient. Potential for esophageal dilation, biopsy, etc. have also been reviewed.  Questions have been answered. All parties agreeable.

## 2017-08-07 NOTE — H&P (View-Only) (Signed)
Primary Care Physician:  Rosita Fire, MD Primary Gastroenterologist:  Dr. Gala Romney   Chief Complaint  Patient presents with  . Dysphagia    HPI:   Hayden Green is a 83 y.o. male presenting today at the request of his PCP secondary to dysphagia.    Has had solid food dysphagia off and on for several years. States at first he didn't pay attention to it. However, Sunday he was swallowing water and it wouldn't go down. Solid food dysphagia noted as well. States he is just noticing these symptoms more often lately. Tries to chew food well, but sometimes it does not help. No odynophagia. Has chronic GERD. Takes Prilosec daily now, but historically had only taken prn. Noted some improvement with dysphagia taking daily. Will rarely take an Aleve. No unexplained weight loss. Appetite is good. Trying not to gain weight.   Last colonoscopy 10-15 years ago at [facility]. Believes it was by Dr. Tamala Julian. No prior EGD. No concerning lower GI symptoms.    Past Medical History:  Diagnosis Date  . Arthritis   . COPD (chronic obstructive pulmonary disease) ([place])   . Diabetes mellitus without complication ([place])   . GERD (gastroesophageal reflux disease)   . Hypertension   . Pneumonia   . Renal cell carcinoma [facility])     Past Surgical History:  Procedure Laterality Date  . CHOLECYSTECTOMY    . right nephrectomy      Current Outpatient Prescriptions  Medication Sig Dispense Refill  . albuterol (PROVENTIL HFA;VENTOLIN HFA) 108 (90 BASE) MCG/ACT inhaler Inhale 2 puffs into the lungs every 6 (six) hours as needed.      . brimonidine (ALPHAGAN P) 0.1 % SOLN Place 1 drop into both eyes 2 (two) times daily.    . Fluticasone-Salmeterol (ADVAIR) 250-50 MCG/DOSE AEPB Inhale 1 puff into the lungs every 12 (twelve) hours.    Marland Kitchen lisinopril (PRINIVIL,ZESTRIL) 20 MG tablet Take 20 mg by mouth daily.      . meclizine (ANTIVERT) 25 MG tablet Take 25 mg by mouth 4 (four) times daily as needed for dizziness.    .  metFORMIN (GLUCOPHAGE) 500 MG tablet Take 1 tablet (500 mg total) by mouth 2 (two) times daily with a meal. 60 tablet 1  . omeprazole (PRILOSEC) 20 MG capsule Take 20 mg by mouth daily as needed (for acid reflux).    . Travoprost, BAK Free, (TRAVATAMN) 0.004 % SOLN ophthalmic solution Place 1 drop into both eyes at bedtime.       No current facility-administered medications for this visit.     Allergies as of 07/12/2017 - Review Complete 07/12/2017  Allergen Reaction Noted  . Reglan [metoclopramide] Swelling and Other (See Comments) 03/22/2013    Family History  Problem Relation Age of Onset  . Colon cancer Neg Hx   . Colon polyps Neg Hx     Social History   Social History  . Marital status: Married    Spouse name: N/A  . Number of children: N/A  . 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 Smoking status: Former Research scientist (life sciences)  . Smokeless tobacco: Former Systems developer  . Alcohol use No  . Drug use: No  . Sexual activity: Not on file   Other Topics Concern  . Not on file   Social History Narrative  . No narrative on file    Review of  Systems: Gen: Denies any fever, chills, fatigue, weight loss, lack of appetite.  CV: Denies chest pain, heart palpitations, peripheral edema, syncope.  Resp: Denies shortness of breath at rest or with exertion. Denies wheezing or cough.  GI: see HPI  GU : Denies urinary burning, urinary frequency, urinary hesitancy MS: Denies joint pain, muscle weakness, cramps, or limitation of movement.  Derm: Denies rash, itching, dry skin Psych: Denies depression, anxiety, memory loss, and confusion Heme: Denies bruising, bleeding, and enlarged lymph nodes.  Physical Exam: BP 140/79   Pulse 68   Temp 98.2 F (36.8 C) (Oral)   Ht 5\' 6"  (1.676 m)   Wt 189 lb 9.6 oz (86 kg)   BMI 30.60 kg/m  General:   Alert and oriented. Pleasant and cooperative. Well-nourished and well-developed.  Appears younger than stated age.  Head:  Normocephalic and atraumatic. Eyes:  Without icterus, sclera clear and conjunctiva pink.  Ears:  Normal auditory acuity. Nose:  No deformity, discharge,  or lesions. Mouth:  No deformity or lesions, oral mucosa pink.  Lungs:  Clear to auscultation bilaterally. No wheezes, rales, or rhonchi. No distress.  Heart:  S1, S2 present without murmurs appreciated.  Abdomen:  +BS, soft, non-tender and non-distended. No HSM noted. No guarding or rebound. No masses appreciated.  Rectal:  Deferred  Msk:  Mild kyphosis  Extremities:  Without edema. Neurologic:  Alert and  oriented x4;  grossly normal neurologically. Psych:  Alert and cooperative. Normal mood and affect.

## 2017-08-12 ENCOUNTER — Encounter (HOSPITAL_COMMUNITY): Payer: Self-pay | Admitting: Internal Medicine

## 2017-08-13 DIAGNOSIS — Z23 Encounter for immunization: Secondary | ICD-10-CM | POA: Diagnosis not present

## 2017-08-13 DIAGNOSIS — I10 Essential (primary) hypertension: Secondary | ICD-10-CM | POA: Diagnosis not present

## 2017-08-13 DIAGNOSIS — J449 Chronic obstructive pulmonary disease, unspecified: Secondary | ICD-10-CM | POA: Diagnosis not present

## 2017-08-13 DIAGNOSIS — E119 Type 2 diabetes mellitus without complications: Secondary | ICD-10-CM | POA: Diagnosis not present

## 2017-08-13 DIAGNOSIS — R1310 Dysphagia, unspecified: Secondary | ICD-10-CM | POA: Diagnosis not present

## 2017-08-20 DIAGNOSIS — H401132 Primary open-angle glaucoma, bilateral, moderate stage: Secondary | ICD-10-CM | POA: Diagnosis not present

## 2017-10-07 DIAGNOSIS — E1165 Type 2 diabetes mellitus with hyperglycemia: Secondary | ICD-10-CM | POA: Diagnosis not present

## 2017-10-07 DIAGNOSIS — J441 Chronic obstructive pulmonary disease with (acute) exacerbation: Secondary | ICD-10-CM | POA: Diagnosis not present

## 2017-10-08 DIAGNOSIS — H401132 Primary open-angle glaucoma, bilateral, moderate stage: Secondary | ICD-10-CM | POA: Diagnosis not present

## 2017-11-18 DIAGNOSIS — M170 Bilateral primary osteoarthritis of knee: Secondary | ICD-10-CM | POA: Diagnosis not present

## 2017-11-18 DIAGNOSIS — M549 Dorsalgia, unspecified: Secondary | ICD-10-CM | POA: Diagnosis not present

## 2017-11-18 DIAGNOSIS — J449 Chronic obstructive pulmonary disease, unspecified: Secondary | ICD-10-CM | POA: Diagnosis not present

## 2017-11-18 DIAGNOSIS — E1165 Type 2 diabetes mellitus with hyperglycemia: Secondary | ICD-10-CM | POA: Diagnosis not present

## 2018-01-13 DIAGNOSIS — Z0001 Encounter for general adult medical examination with abnormal findings: Secondary | ICD-10-CM | POA: Diagnosis not present

## 2018-01-13 DIAGNOSIS — E785 Hyperlipidemia, unspecified: Secondary | ICD-10-CM | POA: Diagnosis not present

## 2018-01-13 DIAGNOSIS — J449 Chronic obstructive pulmonary disease, unspecified: Secondary | ICD-10-CM | POA: Diagnosis not present

## 2018-01-13 DIAGNOSIS — Z0000 Encounter for general adult medical examination without abnormal findings: Secondary | ICD-10-CM | POA: Diagnosis not present

## 2018-01-13 DIAGNOSIS — Z1331 Encounter for screening for depression: Secondary | ICD-10-CM | POA: Diagnosis not present

## 2018-01-13 DIAGNOSIS — I10 Essential (primary) hypertension: Secondary | ICD-10-CM | POA: Diagnosis not present

## 2018-01-13 DIAGNOSIS — Z1389 Encounter for screening for other disorder: Secondary | ICD-10-CM | POA: Diagnosis not present

## 2018-01-13 DIAGNOSIS — E1165 Type 2 diabetes mellitus with hyperglycemia: Secondary | ICD-10-CM | POA: Diagnosis not present

## 2018-02-05 ENCOUNTER — Ambulatory Visit: Payer: Medicare Other | Admitting: Gastroenterology

## 2018-02-11 DIAGNOSIS — H401132 Primary open-angle glaucoma, bilateral, moderate stage: Secondary | ICD-10-CM | POA: Diagnosis not present

## 2018-03-10 DIAGNOSIS — I10 Essential (primary) hypertension: Secondary | ICD-10-CM | POA: Diagnosis not present

## 2018-03-10 DIAGNOSIS — L259 Unspecified contact dermatitis, unspecified cause: Secondary | ICD-10-CM | POA: Diagnosis not present

## 2018-03-10 DIAGNOSIS — E1165 Type 2 diabetes mellitus with hyperglycemia: Secondary | ICD-10-CM | POA: Diagnosis not present

## 2018-04-15 DIAGNOSIS — H401132 Primary open-angle glaucoma, bilateral, moderate stage: Secondary | ICD-10-CM | POA: Diagnosis not present

## 2018-04-21 ENCOUNTER — Ambulatory Visit: Payer: Medicare Other | Admitting: Gastroenterology

## 2018-04-21 ENCOUNTER — Encounter: Payer: Self-pay | Admitting: Gastroenterology

## 2018-04-21 VITALS — BP 142/71 | HR 66 | Temp 97.7°F | Ht 66.0 in | Wt 189.2 lb

## 2018-04-21 DIAGNOSIS — R1310 Dysphagia, unspecified: Secondary | ICD-10-CM | POA: Diagnosis not present

## 2018-04-21 NOTE — Patient Instructions (Signed)
Take Prilosec daily each morning, 30 minutes before breakfast for a month. Then, you can take it every other day, first thing in the morning. If you notice you still have reflux symptoms, go back to taking it once a day.   We will see you back in 1 year or sooner if needed!  It was a pleasure to see you today. I strive to create trusting relationships with patients to provide genuine, compassionate, and quality care. I value your feedback. If you receive a survey regarding your visit,  I greatly appreciate you taking time to fill this out.   Annitta Needs, PhD, ANP-BC [facility]

## 2018-04-21 NOTE — Progress Notes (Signed)
Referring Provider: Rosita Fire, MD Primary Care Physician:  Rosita Fire, MD  Primary GI: Dr. Gala Romney   Chief Complaint  Patient presents with  . Dysphagia    f/u, doing ok    HPI:   Hayden Green is a 84 y.o. male presenting today with a history of dysphagia s/p EGD in Oct 2018 with proximal esophageal web. Here for routine follow-up.    Pepper, tomatoes, spicy foods flares up reflux. Otherwise no reflux exacerbations. Dysphagia resolved. No abdominal pain. Started drinking prune juice, which has helped with constipation. One glass a week. No rectal bleeding. Appetite is too good. Omeprazole just as needed. Not taking on empty stomach, usually midday. Baking soda and Tums depending on what he eats.   Past Medical History:  Diagnosis Date  . Arthritis   . COPD (chronic obstructive pulmonary disease) ([place])   . Diabetes mellitus without complication ([place])   . GERD (gastroesophageal reflux disease)   . Hypertension   . Pneumonia   . Renal cell carcinoma [facility])     Past Surgical History:  Procedure Laterality Date  . CHOLECYSTECTOMY    . ESOPHAGOGASTRODUODENOSCOPY N/A 08/07/2017   Dr. Gala Romney: web in proximal esophagus, moderate acquired Schatzki's ring s/p dilation with 63 and 6 F, three-quarter bites with biopsy forceps to facilitate disruption. small hiatal hernia, duodenum normal  . MALONEY DILATION N/A 08/07/2017   Procedure: Venia Minks DILATION;  Surgeon: Daneil Dolin, MD;  [facility];  Service: Endoscopy;  Laterality: N/A;  . right nephrectomy      Current Outpatient Medications  Medication Sig Dispense Refill  . albuterol (PROVENTIL HFA;VENTOLIN HFA) 108 (90 BASE) MCG/ACT inhaler Inhale 2 puffs into the lungs every 6 (six) hours as needed for wheezing or shortness of breath.     . Fluticasone-Salmeterol (ADVAIR) 250-50 MCG/DOSE AEPB Inhale 1 puff into the lungs every 12 (twelve) hours.    Marland Kitchen lisinopril (PRINIVIL,ZESTRIL) 20 MG tablet Take 20 mg by mouth  daily.      . meclizine (ANTIVERT) 25 MG tablet Take 25 mg by mouth 4 (four) times daily as needed for dizziness.    . metFORMIN (GLUCOPHAGE) 500 MG tablet Take 1 tablet (500 mg total) by mouth 2 (two) times daily with a meal. 60 tablet 1  . naproxen sodium (ANAPROX) 220 MG tablet Take 220 mg by mouth daily as needed (pain).    Marland Kitchen omeprazole (PRILOSEC) 20 MG capsule Take 20 mg by mouth daily as needed (for acid reflux).    Marland Kitchen SIMBRINZA 1-0.2 % SUSP Place 1 drop into both eyes 2 (two) times daily.    Marland Kitchen VYZULTA 0.024 % SOLN Place 1 drop into both eyes at bedtime.     No current facility-administered medications for this visit.     Allergies as of 04/21/2018 - Review Complete 04/21/2018  Allergen Reaction Noted  . Reglan [metoclopramide] Swelling and Other (See Comments) 03/22/2013    Family History  Problem Relation Age of Onset  . Colon cancer Neg Hx   . Colon polyps Neg Hx     Social History   Socioeconomic History  . Marital status: Married    Spouse name: Not on file  . Number of children: Not on file  . Years of education: Not on file  . Highest education level: Not on file  Occupational History  . Occupation: Theme park manager    Comment: came out of retirement  Social Needs  . Financial resource strain: Not on file  .  Food insecurity:    Worry: Not on file    Inability: Not on file  . Transportation needs:    Medical: Not on file    Non-medical: Not on file  Tobacco Use  . Smoking status: Former Research scientist (life sciences)  . Smokeless tobacco: Former Network engineer and Sexual Activity  . Alcohol use: No  . Drug use: No  . Sexual activity: Not on file  Lifestyle  . Physical activity:    Days per week: Not on file    Minutes per session: Not on file  . Stress: Not on file  Relationships  . Social connections:    Talks on phone: Not on file    Gets together: Not on file    Attends religious service: Not on file    Active member of club or organization: Not on file    Attends meetings of  clubs or organizations: Not on file    Relationship status: Not on file  Other Topics Concern  . Not on file  Social History Narrative  . Not on file    Review of Systems: Gen: Denies fever, chills, anorexia. Denies fatigue, weakness, weight loss.  CV: Denies chest pain, palpitations, syncope, peripheral edema, and claudication. Resp: Denies dyspnea at rest, cough, wheezing, coughing up blood, and pleurisy. GI: see HPI  Derm: Denies rash, itching, dry skin Psych: Denies depression, anxiety, memory loss, confusion. No homicidal or suicidal ideation.  Heme: Denies bruising, bleeding, and enlarged lymph nodes.  Physical Exam: BP (!) 142/71   Pulse 66   Temp 97.7 F (36.5 C) (Oral)   Ht 5\' 6"  (1.676 m)   Wt 189 lb 3.2 oz (85.8 kg)   BMI 30.54 kg/m  General:   Alert and oriented. No distress noted. Pleasant and cooperative.  Head:  Normocephalic and atraumatic. Eyes:  Conjuctiva clear without scleral icterus. Mouth:  Oral mucosa pink and moist.  Abdomen:  +BS, soft, non-tender and non-distended. No rebound or guarding. No HSM or masses noted. Msk:  Symmetrical without gross deformities. Normal posture. Extremities:  Without edema. Neurologic:  Alert and  oriented x4 Psych:  Alert and cooperative. Normal mood and affect.

## 2018-04-25 NOTE — Assessment & Plan Note (Signed)
Resolved s/p dilation of proximal esophageal web. Intermittent reflux exacerbations and taking Prilosec with flares. Discussed appropriate timing for maximal efficacy. Resume once per day for next 30 days, then go wean to every other day. May need daily but will titrate down to lowest dose that provides symptomatic control as he does not have complicated GERD. Return in 1 year or sooner as needed.

## 2018-04-28 NOTE — Progress Notes (Signed)
cc'ed to pcp °

## 2018-06-16 DIAGNOSIS — J449 Chronic obstructive pulmonary disease, unspecified: Secondary | ICD-10-CM | POA: Diagnosis not present

## 2018-06-16 DIAGNOSIS — I10 Essential (primary) hypertension: Secondary | ICD-10-CM | POA: Diagnosis not present

## 2018-06-16 DIAGNOSIS — E1142 Type 2 diabetes mellitus with diabetic polyneuropathy: Secondary | ICD-10-CM | POA: Diagnosis not present

## 2018-06-16 DIAGNOSIS — K219 Gastro-esophageal reflux disease without esophagitis: Secondary | ICD-10-CM | POA: Diagnosis not present

## 2018-07-15 DIAGNOSIS — H401132 Primary open-angle glaucoma, bilateral, moderate stage: Secondary | ICD-10-CM | POA: Diagnosis not present

## 2018-08-12 DIAGNOSIS — Z23 Encounter for immunization: Secondary | ICD-10-CM | POA: Diagnosis not present

## 2018-08-14 DIAGNOSIS — R42 Dizziness and giddiness: Secondary | ICD-10-CM | POA: Diagnosis not present

## 2018-08-14 DIAGNOSIS — E1165 Type 2 diabetes mellitus with hyperglycemia: Secondary | ICD-10-CM | POA: Diagnosis not present

## 2018-08-29 DIAGNOSIS — H401132 Primary open-angle glaucoma, bilateral, moderate stage: Secondary | ICD-10-CM | POA: Diagnosis not present

## 2018-09-15 DIAGNOSIS — I10 Essential (primary) hypertension: Secondary | ICD-10-CM | POA: Diagnosis not present

## 2018-09-15 DIAGNOSIS — J449 Chronic obstructive pulmonary disease, unspecified: Secondary | ICD-10-CM | POA: Diagnosis not present

## 2018-09-15 DIAGNOSIS — E1142 Type 2 diabetes mellitus with diabetic polyneuropathy: Secondary | ICD-10-CM | POA: Diagnosis not present

## 2018-09-15 DIAGNOSIS — K219 Gastro-esophageal reflux disease without esophagitis: Secondary | ICD-10-CM | POA: Diagnosis not present

## 2018-09-24 DIAGNOSIS — H401132 Primary open-angle glaucoma, bilateral, moderate stage: Secondary | ICD-10-CM | POA: Diagnosis not present

## 2018-11-04 DIAGNOSIS — J209 Acute bronchitis, unspecified: Secondary | ICD-10-CM | POA: Diagnosis not present

## 2018-11-04 DIAGNOSIS — E119 Type 2 diabetes mellitus without complications: Secondary | ICD-10-CM | POA: Diagnosis not present

## 2018-11-04 DIAGNOSIS — I10 Essential (primary) hypertension: Secondary | ICD-10-CM | POA: Diagnosis not present

## 2018-11-04 DIAGNOSIS — J449 Chronic obstructive pulmonary disease, unspecified: Secondary | ICD-10-CM | POA: Diagnosis not present

## 2018-12-29 DIAGNOSIS — E1142 Type 2 diabetes mellitus with diabetic polyneuropathy: Secondary | ICD-10-CM | POA: Diagnosis not present

## 2018-12-29 DIAGNOSIS — Z1389 Encounter for screening for other disorder: Secondary | ICD-10-CM | POA: Diagnosis not present

## 2018-12-29 DIAGNOSIS — Z0001 Encounter for general adult medical examination with abnormal findings: Secondary | ICD-10-CM | POA: Diagnosis not present

## 2018-12-29 DIAGNOSIS — E1165 Type 2 diabetes mellitus with hyperglycemia: Secondary | ICD-10-CM | POA: Diagnosis not present

## 2018-12-29 DIAGNOSIS — I10 Essential (primary) hypertension: Secondary | ICD-10-CM | POA: Diagnosis not present

## 2018-12-29 DIAGNOSIS — Z0000 Encounter for general adult medical examination without abnormal findings: Secondary | ICD-10-CM | POA: Diagnosis not present

## 2018-12-29 DIAGNOSIS — J449 Chronic obstructive pulmonary disease, unspecified: Secondary | ICD-10-CM | POA: Diagnosis not present

## 2018-12-29 DIAGNOSIS — E785 Hyperlipidemia, unspecified: Secondary | ICD-10-CM | POA: Diagnosis not present

## 2019-02-02 DIAGNOSIS — M79672 Pain in left foot: Secondary | ICD-10-CM | POA: Diagnosis not present

## 2019-02-02 DIAGNOSIS — M79671 Pain in right foot: Secondary | ICD-10-CM | POA: Diagnosis not present

## 2019-02-02 DIAGNOSIS — I739 Peripheral vascular disease, unspecified: Secondary | ICD-10-CM | POA: Diagnosis not present

## 2019-02-02 DIAGNOSIS — E1140 Type 2 diabetes mellitus with diabetic neuropathy, unspecified: Secondary | ICD-10-CM | POA: Diagnosis not present

## 2019-02-02 DIAGNOSIS — L110 Acquired keratosis follicularis: Secondary | ICD-10-CM | POA: Diagnosis not present

## 2019-03-20 DIAGNOSIS — H401132 Primary open-angle glaucoma, bilateral, moderate stage: Secondary | ICD-10-CM | POA: Diagnosis not present

## 2019-04-14 ENCOUNTER — Encounter: Payer: Self-pay | Admitting: Internal Medicine

## 2019-04-27 DIAGNOSIS — I739 Peripheral vascular disease, unspecified: Secondary | ICD-10-CM | POA: Diagnosis not present

## 2019-04-27 DIAGNOSIS — M79672 Pain in left foot: Secondary | ICD-10-CM | POA: Diagnosis not present

## 2019-04-27 DIAGNOSIS — M79671 Pain in right foot: Secondary | ICD-10-CM | POA: Diagnosis not present

## 2019-04-27 DIAGNOSIS — E1140 Type 2 diabetes mellitus with diabetic neuropathy, unspecified: Secondary | ICD-10-CM | POA: Diagnosis not present

## 2019-04-27 DIAGNOSIS — E1151 Type 2 diabetes mellitus with diabetic peripheral angiopathy without gangrene: Secondary | ICD-10-CM | POA: Diagnosis not present

## 2019-04-29 DIAGNOSIS — J449 Chronic obstructive pulmonary disease, unspecified: Secondary | ICD-10-CM | POA: Diagnosis not present

## 2019-04-29 DIAGNOSIS — I10 Essential (primary) hypertension: Secondary | ICD-10-CM | POA: Diagnosis not present

## 2019-04-29 DIAGNOSIS — E1142 Type 2 diabetes mellitus with diabetic polyneuropathy: Secondary | ICD-10-CM | POA: Diagnosis not present

## 2019-04-29 DIAGNOSIS — K219 Gastro-esophageal reflux disease without esophagitis: Secondary | ICD-10-CM | POA: Diagnosis not present

## 2019-05-29 DIAGNOSIS — I10 Essential (primary) hypertension: Secondary | ICD-10-CM | POA: Diagnosis not present

## 2019-05-29 DIAGNOSIS — E1165 Type 2 diabetes mellitus with hyperglycemia: Secondary | ICD-10-CM | POA: Diagnosis not present

## 2019-06-29 DIAGNOSIS — E1165 Type 2 diabetes mellitus with hyperglycemia: Secondary | ICD-10-CM | POA: Diagnosis not present

## 2019-06-29 DIAGNOSIS — J449 Chronic obstructive pulmonary disease, unspecified: Secondary | ICD-10-CM | POA: Diagnosis not present

## 2019-07-03 DIAGNOSIS — H401132 Primary open-angle glaucoma, bilateral, moderate stage: Secondary | ICD-10-CM | POA: Diagnosis not present

## 2019-07-08 DIAGNOSIS — H401132 Primary open-angle glaucoma, bilateral, moderate stage: Secondary | ICD-10-CM | POA: Diagnosis not present

## 2019-07-27 DIAGNOSIS — E1139 Type 2 diabetes mellitus with other diabetic ophthalmic complication: Secondary | ICD-10-CM | POA: Diagnosis not present

## 2019-07-27 DIAGNOSIS — Z961 Presence of intraocular lens: Secondary | ICD-10-CM | POA: Diagnosis not present

## 2019-07-27 DIAGNOSIS — Z87891 Personal history of nicotine dependence: Secondary | ICD-10-CM | POA: Diagnosis not present

## 2019-07-27 DIAGNOSIS — H4089 Other specified glaucoma: Secondary | ICD-10-CM | POA: Diagnosis not present

## 2019-07-27 DIAGNOSIS — H4052X4 Glaucoma secondary to other eye disorders, left eye, indeterminate stage: Secondary | ICD-10-CM | POA: Diagnosis not present

## 2019-07-27 DIAGNOSIS — I10 Essential (primary) hypertension: Secondary | ICD-10-CM | POA: Diagnosis not present

## 2019-07-27 DIAGNOSIS — Z7984 Long term (current) use of oral hypoglycemic drugs: Secondary | ICD-10-CM | POA: Diagnosis not present

## 2019-07-27 DIAGNOSIS — Z79899 Other long term (current) drug therapy: Secondary | ICD-10-CM | POA: Diagnosis not present

## 2019-07-27 DIAGNOSIS — J449 Chronic obstructive pulmonary disease, unspecified: Secondary | ICD-10-CM | POA: Diagnosis not present

## 2019-07-30 DIAGNOSIS — J449 Chronic obstructive pulmonary disease, unspecified: Secondary | ICD-10-CM | POA: Diagnosis not present

## 2019-07-30 DIAGNOSIS — E1165 Type 2 diabetes mellitus with hyperglycemia: Secondary | ICD-10-CM | POA: Diagnosis not present

## 2019-08-04 DIAGNOSIS — J449 Chronic obstructive pulmonary disease, unspecified: Secondary | ICD-10-CM | POA: Diagnosis not present

## 2019-08-04 DIAGNOSIS — E1142 Type 2 diabetes mellitus with diabetic polyneuropathy: Secondary | ICD-10-CM | POA: Diagnosis not present

## 2019-08-04 DIAGNOSIS — K219 Gastro-esophageal reflux disease without esophagitis: Secondary | ICD-10-CM | POA: Diagnosis not present

## 2019-08-04 DIAGNOSIS — Z23 Encounter for immunization: Secondary | ICD-10-CM | POA: Diagnosis not present

## 2019-08-04 DIAGNOSIS — I10 Essential (primary) hypertension: Secondary | ICD-10-CM | POA: Diagnosis not present

## 2019-08-05 ENCOUNTER — Other Ambulatory Visit: Payer: Self-pay

## 2019-08-05 NOTE — Patient Outreach (Signed)
[place] [facility]) Care Management  08/05/2019  Hayden Green 02-18-1928 [id number]   Medication Adherence call to Hayden Green Hipp[facility] Identifiers Verify spoke with patient he is past due on Metformin 500 mg patient explain he is taking 1 tablet two times daily he has a few left,patient ask if we can call [facility]. Hayden Green is showing past due under [place].   [place] Management Direct Dial [phone]  Fax [phone] [email or web address]

## 2019-09-03 DIAGNOSIS — M549 Dorsalgia, unspecified: Secondary | ICD-10-CM | POA: Diagnosis not present

## 2019-09-03 DIAGNOSIS — E1165 Type 2 diabetes mellitus with hyperglycemia: Secondary | ICD-10-CM | POA: Diagnosis not present

## 2019-09-21 DIAGNOSIS — E1140 Type 2 diabetes mellitus with diabetic neuropathy, unspecified: Secondary | ICD-10-CM | POA: Diagnosis not present

## 2019-09-21 DIAGNOSIS — L110 Acquired keratosis follicularis: Secondary | ICD-10-CM | POA: Diagnosis not present

## 2019-09-21 DIAGNOSIS — M79672 Pain in left foot: Secondary | ICD-10-CM | POA: Diagnosis not present

## 2019-09-21 DIAGNOSIS — I739 Peripheral vascular disease, unspecified: Secondary | ICD-10-CM | POA: Diagnosis not present

## 2019-09-21 DIAGNOSIS — M79671 Pain in right foot: Secondary | ICD-10-CM | POA: Diagnosis not present

## 2019-10-04 DIAGNOSIS — K219 Gastro-esophageal reflux disease without esophagitis: Secondary | ICD-10-CM | POA: Diagnosis not present

## 2019-10-04 DIAGNOSIS — M549 Dorsalgia, unspecified: Secondary | ICD-10-CM | POA: Diagnosis not present

## 2019-11-03 DIAGNOSIS — I10 Essential (primary) hypertension: Secondary | ICD-10-CM | POA: Diagnosis not present

## 2019-11-03 DIAGNOSIS — E1165 Type 2 diabetes mellitus with hyperglycemia: Secondary | ICD-10-CM | POA: Diagnosis not present

## 2019-11-03 DIAGNOSIS — H401132 Primary open-angle glaucoma, bilateral, moderate stage: Secondary | ICD-10-CM | POA: Diagnosis not present

## 2019-12-04 DIAGNOSIS — M549 Dorsalgia, unspecified: Secondary | ICD-10-CM | POA: Diagnosis not present

## 2019-12-04 DIAGNOSIS — E1165 Type 2 diabetes mellitus with hyperglycemia: Secondary | ICD-10-CM | POA: Diagnosis not present

## 2019-12-08 DIAGNOSIS — H401123 Primary open-angle glaucoma, left eye, severe stage: Secondary | ICD-10-CM | POA: Diagnosis not present

## 2019-12-08 DIAGNOSIS — H401112 Primary open-angle glaucoma, right eye, moderate stage: Secondary | ICD-10-CM | POA: Diagnosis not present

## 2019-12-15 DIAGNOSIS — J449 Chronic obstructive pulmonary disease, unspecified: Secondary | ICD-10-CM | POA: Diagnosis not present

## 2019-12-15 DIAGNOSIS — I10 Essential (primary) hypertension: Secondary | ICD-10-CM | POA: Diagnosis not present

## 2019-12-15 DIAGNOSIS — E1142 Type 2 diabetes mellitus with diabetic polyneuropathy: Secondary | ICD-10-CM | POA: Diagnosis not present

## 2019-12-15 DIAGNOSIS — K219 Gastro-esophageal reflux disease without esophagitis: Secondary | ICD-10-CM | POA: Diagnosis not present

## 2019-12-15 DIAGNOSIS — Z1389 Encounter for screening for other disorder: Secondary | ICD-10-CM | POA: Diagnosis not present

## 2019-12-15 DIAGNOSIS — Z0001 Encounter for general adult medical examination with abnormal findings: Secondary | ICD-10-CM | POA: Diagnosis not present

## 2019-12-29 DIAGNOSIS — M79672 Pain in left foot: Secondary | ICD-10-CM | POA: Diagnosis not present

## 2019-12-29 DIAGNOSIS — E1140 Type 2 diabetes mellitus with diabetic neuropathy, unspecified: Secondary | ICD-10-CM | POA: Diagnosis not present

## 2019-12-29 DIAGNOSIS — M79671 Pain in right foot: Secondary | ICD-10-CM | POA: Diagnosis not present

## 2019-12-29 DIAGNOSIS — I739 Peripheral vascular disease, unspecified: Secondary | ICD-10-CM | POA: Diagnosis not present

## 2019-12-29 DIAGNOSIS — L110 Acquired keratosis follicularis: Secondary | ICD-10-CM | POA: Diagnosis not present

## 2020-01-05 DIAGNOSIS — H401123 Primary open-angle glaucoma, left eye, severe stage: Secondary | ICD-10-CM | POA: Diagnosis not present

## 2020-01-12 DIAGNOSIS — M549 Dorsalgia, unspecified: Secondary | ICD-10-CM | POA: Diagnosis not present

## 2020-01-12 DIAGNOSIS — I10 Essential (primary) hypertension: Secondary | ICD-10-CM | POA: Diagnosis not present

## 2020-02-12 DIAGNOSIS — K219 Gastro-esophageal reflux disease without esophagitis: Secondary | ICD-10-CM | POA: Diagnosis not present

## 2020-02-12 DIAGNOSIS — I10 Essential (primary) hypertension: Secondary | ICD-10-CM | POA: Diagnosis not present

## 2020-02-23 DIAGNOSIS — H4052X4 Glaucoma secondary to other eye disorders, left eye, indeterminate stage: Secondary | ICD-10-CM | POA: Diagnosis not present

## 2020-03-13 DIAGNOSIS — J449 Chronic obstructive pulmonary disease, unspecified: Secondary | ICD-10-CM | POA: Diagnosis not present

## 2020-03-13 DIAGNOSIS — K219 Gastro-esophageal reflux disease without esophagitis: Secondary | ICD-10-CM | POA: Diagnosis not present

## 2020-03-21 DIAGNOSIS — I739 Peripheral vascular disease, unspecified: Secondary | ICD-10-CM | POA: Diagnosis not present

## 2020-03-21 DIAGNOSIS — M79672 Pain in left foot: Secondary | ICD-10-CM | POA: Diagnosis not present

## 2020-03-21 DIAGNOSIS — M79671 Pain in right foot: Secondary | ICD-10-CM | POA: Diagnosis not present

## 2020-03-21 DIAGNOSIS — E1140 Type 2 diabetes mellitus with diabetic neuropathy, unspecified: Secondary | ICD-10-CM | POA: Diagnosis not present

## 2020-03-21 DIAGNOSIS — L110 Acquired keratosis follicularis: Secondary | ICD-10-CM | POA: Diagnosis not present

## 2020-04-13 DIAGNOSIS — M549 Dorsalgia, unspecified: Secondary | ICD-10-CM | POA: Diagnosis not present

## 2020-04-13 DIAGNOSIS — K219 Gastro-esophageal reflux disease without esophagitis: Secondary | ICD-10-CM | POA: Diagnosis not present

## 2020-05-13 DIAGNOSIS — J449 Chronic obstructive pulmonary disease, unspecified: Secondary | ICD-10-CM | POA: Diagnosis not present

## 2020-05-13 DIAGNOSIS — E1165 Type 2 diabetes mellitus with hyperglycemia: Secondary | ICD-10-CM | POA: Diagnosis not present

## 2020-06-06 DIAGNOSIS — I739 Peripheral vascular disease, unspecified: Secondary | ICD-10-CM | POA: Diagnosis not present

## 2020-06-06 DIAGNOSIS — L110 Acquired keratosis follicularis: Secondary | ICD-10-CM | POA: Diagnosis not present

## 2020-06-06 DIAGNOSIS — E1140 Type 2 diabetes mellitus with diabetic neuropathy, unspecified: Secondary | ICD-10-CM | POA: Diagnosis not present

## 2020-06-06 DIAGNOSIS — M79672 Pain in left foot: Secondary | ICD-10-CM | POA: Diagnosis not present

## 2020-06-06 DIAGNOSIS — M79671 Pain in right foot: Secondary | ICD-10-CM | POA: Diagnosis not present

## 2020-06-07 DIAGNOSIS — I10 Essential (primary) hypertension: Secondary | ICD-10-CM | POA: Diagnosis not present

## 2020-06-07 DIAGNOSIS — J449 Chronic obstructive pulmonary disease, unspecified: Secondary | ICD-10-CM | POA: Diagnosis not present

## 2020-06-07 DIAGNOSIS — K219 Gastro-esophageal reflux disease without esophagitis: Secondary | ICD-10-CM | POA: Diagnosis not present

## 2020-06-07 DIAGNOSIS — H401123 Primary open-angle glaucoma, left eye, severe stage: Secondary | ICD-10-CM | POA: Diagnosis not present

## 2020-06-07 DIAGNOSIS — E1142 Type 2 diabetes mellitus with diabetic polyneuropathy: Secondary | ICD-10-CM | POA: Diagnosis not present

## 2020-07-02 ENCOUNTER — Emergency Department (HOSPITAL_COMMUNITY)
Admission: EM | Admit: 2020-07-02 | Discharge: 2020-07-02 | Disposition: A | Discharge status: Home / Self Care | Payer: Medicare Other | Attending: Emergency Medicine | Admitting: Emergency Medicine

## 2020-07-02 ENCOUNTER — Emergency Department (HOSPITAL_COMMUNITY): Payer: Medicare Other

## 2020-07-02 ENCOUNTER — Encounter (HOSPITAL_COMMUNITY): Payer: Self-pay

## 2020-07-02 DIAGNOSIS — Z79899 Other long term (current) drug therapy: Secondary | ICD-10-CM | POA: Insufficient documentation

## 2020-07-02 DIAGNOSIS — Z7984 Long term (current) use of oral hypoglycemic drugs: Secondary | ICD-10-CM | POA: Diagnosis not present

## 2020-07-02 DIAGNOSIS — R310 Gross hematuria: Secondary | ICD-10-CM | POA: Diagnosis not present

## 2020-07-02 DIAGNOSIS — J449 Chronic obstructive pulmonary disease, unspecified: Secondary | ICD-10-CM | POA: Diagnosis not present

## 2020-07-02 DIAGNOSIS — E119 Type 2 diabetes mellitus without complications: Secondary | ICD-10-CM | POA: Insufficient documentation

## 2020-07-02 DIAGNOSIS — Z87891 Personal history of nicotine dependence: Secondary | ICD-10-CM | POA: Insufficient documentation

## 2020-07-02 DIAGNOSIS — I700 Atherosclerosis of aorta: Secondary | ICD-10-CM | POA: Diagnosis not present

## 2020-07-02 DIAGNOSIS — Z7951 Long term (current) use of inhaled steroids: Secondary | ICD-10-CM | POA: Diagnosis not present

## 2020-07-02 DIAGNOSIS — K5730 Diverticulosis of large intestine without perforation or abscess without bleeding: Secondary | ICD-10-CM | POA: Diagnosis not present

## 2020-07-02 DIAGNOSIS — Z85528 Personal history of other malignant neoplasm of kidney: Secondary | ICD-10-CM | POA: Insufficient documentation

## 2020-07-02 DIAGNOSIS — I10 Essential (primary) hypertension: Secondary | ICD-10-CM | POA: Diagnosis not present

## 2020-07-02 DIAGNOSIS — E278 Other specified disorders of adrenal gland: Secondary | ICD-10-CM | POA: Diagnosis not present

## 2020-07-02 LAB — CBC WITH DIFFERENTIAL/PLATELET
Abs Immature Granulocytes: 0.01 10*3/uL (ref 0.00–0.07)
Basophils Absolute: 0 10*3/uL (ref 0.0–0.1)
Basophils Relative: 0 %
Eosinophils Absolute: 0.8 10*3/uL — ABNORMAL HIGH (ref 0.0–0.5)
Eosinophils Relative: 13 %
HCT: 38.1 % — ABNORMAL LOW (ref 39.0–52.0)
Hemoglobin: 11.7 g/dL — ABNORMAL LOW (ref 13.0–17.0)
Immature Granulocytes: 0 %
Lymphocytes Relative: 33 %
Lymphs Abs: 2 10*3/uL (ref 0.7–4.0)
MCH: 27.8 pg (ref 26.0–34.0)
MCHC: 30.7 g/dL (ref 30.0–36.0)
MCV: 90.5 fL (ref 80.0–100.0)
Monocytes Absolute: 0.4 10*3/uL (ref 0.1–1.0)
Monocytes Relative: 7 %
Neutro Abs: 2.8 10*3/uL (ref 1.7–7.7)
Neutrophils Relative %: 47 %
Platelets: 150 10*3/uL (ref 150–400)
RBC: 4.21 MIL/uL — ABNORMAL LOW (ref 4.22–5.81)
RDW: 12.5 % (ref 11.5–15.5)
WBC: 6.1 10*3/uL (ref 4.0–10.5)
nRBC: 0 % (ref 0.0–0.2)

## 2020-07-02 LAB — URINALYSIS, MICROSCOPIC (REFLEX): RBC / HPF: >50 RBC/hpf (ref 0–5)

## 2020-07-02 LAB — COMPREHENSIVE METABOLIC PANEL
ALT: 14 U/L (ref 0–44)
AST: 20 U/L (ref 15–41)
Albumin: 4.3 g/dL (ref 3.5–5.0)
Alkaline Phosphatase: 65 U/L (ref 38–126)
Anion gap: 10 (ref 5–15)
BUN: 28 mg/dL — ABNORMAL HIGH (ref 8–23)
CO2: 25 mmol/L (ref 22–32)
Calcium: 9.9 mg/dL (ref 8.9–10.3)
Chloride: 104 mmol/L (ref 98–111)
Creatinine, Ser: 1.35 mg/dL — ABNORMAL HIGH (ref 0.61–1.24)
GFR calc Af Amer: 53 mL/min — ABNORMAL LOW (ref >60)
GFR calc non Af Amer: 46 mL/min — ABNORMAL LOW (ref >60)
Glucose, Bld: 132 mg/dL — ABNORMAL HIGH (ref 70–99)
Potassium: 4.3 mmol/L (ref 3.5–5.1)
Sodium: 139 mmol/L (ref 135–145)
Total Bilirubin: 1.4 mg/dL — ABNORMAL HIGH (ref 0.3–1.2)
Total Protein: 7.5 g/dL (ref 6.5–8.1)

## 2020-07-02 LAB — URINALYSIS, ROUTINE W REFLEX MICROSCOPIC
Specific Gravity, Urine: 1.015 (ref 1.005–1.030)
pH: 7 (ref 5.0–8.0)

## 2020-07-02 MED ORDER — LIDOCAINE HCL (PF) 1 % IJ SOLN
INTRAMUSCULAR | Status: AC
  Filled 2020-07-02: qty 30

## 2020-07-02 MED ORDER — CIPROFLOXACIN HCL 500 MG PO TABS: 500.0000 mg | ORAL_TABLET | Freq: Two times a day (BID) | ORAL | 0 refills | Status: AC

## 2020-07-02 MED ORDER — CEFTRIAXONE SODIUM 1 G IJ SOLR
1.0000 g | Freq: Once | INTRAMUSCULAR | Status: AC
  Administered 2020-07-02: 1 g via INTRAMUSCULAR
  Filled 2020-07-02: qty 10

## 2020-07-02 NOTE — ED Triage Notes (Signed)
Pt was passing blood in his urine. Denies any pain with urinating. No history of this.

## 2020-07-02 NOTE — Discharge Instructions (Addendum)
Please take antibiotics, as directed.  You will need to be evaluated by urology, may require cystoscopy for further evaluation of your bladder mass/clot.  I have placed an ambulatory referral which means that they should reach out to you via phone.  However, please call the office to schedule an appointment if you have not heard back from them.  Please notify your primary care provider regarding today's encounter.  Return to the ED or seek immediate medical attention should you experience any new or worsening symptoms.  This includes any lightheadedness/dizziness, chest pain or difficulty breathing, abdominal pain/back pain, fevers or chills, or urinary retention.

## 2020-07-02 NOTE — ED Provider Notes (Signed)
[facility] EMERGENCY DEPARTMENT Provider Note   CSN: [id number] Arrival date & time: 07/02/20  4174     History Chief Complaint  Patient presents with  . Hematuria    Hayden Green is a 86 y.o. male PMH significant for type II DM on Metformin, HTN, and right sided RCC s/p excision in 2008 who presents the ED with a 1 day history of gross hematuria.  Patient first noticed his hematuria this morning.  Patient has not on any anticoagulation and denies any recent beets.  He denies history of this happening previously.  Patient also denies any recent illness or infection, fevers or chills, abdominal pain, chest pain, back pain, pain with urination, increased urinary frequency, or other symptoms.  His procedure is throughout micturition.  HPI     Past Medical History:  Diagnosis Date  . Arthritis   . COPD (chronic obstructive pulmonary disease) ([place])   . Diabetes mellitus without complication ([place])   . GERD (gastroesophageal reflux disease)   . Hypertension   . Pneumonia   . Renal cell carcinoma [facility])     Patient Active Problem List   Diagnosis Date Noted  . Dysphagia 07/12/2017    Past Surgical History:  Procedure Laterality Date  . CHOLECYSTECTOMY    . ESOPHAGOGASTRODUODENOSCOPY N/A 08/07/2017   Dr. Gala Romney: web in proximal esophagus, moderate acquired Schatzki's ring s/p dilation with 78 and 41 F, three-quarter bites with biopsy forceps to facilitate disruption. small hiatal hernia, duodenum normal  . MALONEY DILATION N/A 08/07/2017   Procedure: Venia Minks DILATION;  Surgeon: Daneil Dolin, MD;  [facility];  Service: Endoscopy;  Laterality: N/A;  . right nephrectomy         Family History  Problem Relation Age of Onset  . Colon cancer Neg Hx   . Colon polyps Neg Hx     Social History   Tobacco Use  . Smoking status: Former Research scientist (life sciences)  . Smokeless tobacco: Former Network engineer  . Vaping Use: Never used  Substance Use Topics  . Alcohol use: No  . Drug  use: No    Home Medications Prior to Admission medications   Medication Sig Start Date End Date Taking? Authorizing Provider  albuterol (PROVENTIL HFA;VENTOLIN HFA) 108 (90 BASE) MCG/ACT inhaler Inhale 2 puffs into the lungs every 6 (six) hours as needed for wheezing or shortness of breath.     [provider]  ciprofloxacin (CIPRO) 500 MG tablet Take 1 tablet (500 mg total) by mouth every 12 (twelve) hours for 10 days. 07/02/20 07/12/20  Corena Herter, PA-C  Fluticasone-Salmeterol (ADVAIR) 250-50 MCG/DOSE AEPB Inhale 1 puff into the lungs every 12 (twelve) hours.    [provider]  lisinopril (PRINIVIL,ZESTRIL) 20 MG tablet Take 20 mg by mouth daily.      [provider]  meclizine (ANTIVERT) 25 MG tablet Take 25 mg by mouth 4 (four) times daily as needed for dizziness.    [provider]  metFORMIN (GLUCOPHAGE) 500 MG tablet Take 1 tablet (500 mg total) by mouth 2 (two) times daily with a meal. 09/11/11   Rosita Fire, MD  naproxen sodium (ANAPROX) 220 MG tablet Take 220 mg by mouth daily as needed (pain).    [provider]  omeprazole (PRILOSEC) 20 MG capsule Take 20 mg by mouth daily as needed (for acid reflux).    [provider]  SIMBRINZA 1-0.2 % SUSP Place 1 drop into both eyes 2 (two) times daily. 06/08/17  [provider]  VYZULTA 0.024 % SOLN Place 1 drop into both eyes at bedtime. 02/11/18   [provider]    Allergies    Reglan [metoclopramide]  Review of Systems   Review of Systems  All other systems reviewed and are negative.   Physical Exam Updated Vital Signs BP 98/74 (BP Location: Right Arm)   Pulse 74   Temp 98 F (36.7 C) (Oral)   Resp 18   Ht 5\' 6"  (1.676 m)   Wt 81.6 kg   SpO2 100%   BMI 29.05 kg/m   Physical Exam Vitals and nursing note reviewed. Exam conducted with a chaperone present.  Constitutional:      General: He is not in acute distress.    Appearance: Normal appearance.  He is not ill-appearing.  HENT:     Head: Normocephalic and atraumatic.  Eyes:     General: No scleral icterus.    Conjunctiva/sclera: Conjunctivae normal.  Cardiovascular:     Rate and Rhythm: Normal rate and regular rhythm.     Pulses: Normal pulses.     Heart sounds: Normal heart sounds.  Pulmonary:     Effort: Pulmonary effort is normal. No respiratory distress.     Breath sounds: Normal breath sounds. No stridor. No wheezing, rhonchi or rales.  Abdominal:     Comments: Soft, nondistended.  No areas of TTP.  No guarding.  No overlying skin changes.  No CVAT bilaterally.  No midline spinal TTP.  Musculoskeletal:        General: Normal range of motion.     Cervical back: Normal range of motion.  Skin:    General: Skin is dry.     Capillary Refill: Capillary refill takes less than 2 seconds.  Neurological:     General: No focal deficit present.     Mental Status: He is alert and oriented to person, place, and time.     GCS: GCS eye subscore is 4. GCS verbal subscore is 5. GCS motor subscore is 6.  Psychiatric:        Mood and Affect: Mood normal.        Behavior: Behavior normal.        Thought Content: Thought content normal.     ED Results / Procedures / Treatments   Labs (all labs ordered are listed, but only abnormal results are displayed) Labs Reviewed  URINALYSIS, ROUTINE W REFLEX MICROSCOPIC - Abnormal; Notable for the following components:      Result Value   Color, Urine RED (*)    APPearance TURBID (*)    Glucose, UA   (*)    Value: TEST NOT REPORTED DUE TO COLOR INTERFERENCE OF URINE PIGMENT   Hgb urine dipstick   (*)    Value: TEST NOT REPORTED DUE TO COLOR INTERFERENCE OF URINE PIGMENT   Bilirubin Urine   (*)    Value: TEST NOT REPORTED DUE TO COLOR INTERFERENCE OF URINE PIGMENT   Ketones, ur   (*)    Value: TEST NOT REPORTED DUE TO COLOR INTERFERENCE OF URINE PIGMENT   Protein, ur   (*)    Value: TEST NOT REPORTED DUE TO COLOR INTERFERENCE OF URINE  PIGMENT   Nitrite   (*)    Value: TEST NOT REPORTED DUE TO COLOR INTERFERENCE OF URINE PIGMENT   Leukocytes,Ua   (*)    Value: TEST NOT REPORTED DUE TO COLOR INTERFERENCE OF URINE PIGMENT   All other components within normal limits  CBC  WITH DIFFERENTIAL/PLATELET - Abnormal; Notable for the following components:   RBC 4.21 (*)    Hemoglobin 11.7 (*)    HCT 38.1 (*)    Eosinophils Absolute 0.8 (*)    All other components within normal limits  COMPREHENSIVE METABOLIC PANEL - Abnormal; Notable for the following components:   Glucose, Bld 132 (*)    BUN 28 (*)    Creatinine, Ser 1.35 (*)    Total Bilirubin 1.4 (*)    GFR calc non Af Amer 46 (*)    GFR calc Af Amer 53 (*)    All other components within normal limits  URINALYSIS, MICROSCOPIC (REFLEX) - Abnormal; Notable for the following components:   Bacteria, UA MANY (*)    All other components within normal limits  URINE CULTURE    EKG None  Radiology CT Renal Stone Study  Result Date: 07/02/2020 CLINICAL DATA:  Hematuria noticed this morning, unknown cause, denies pain EXAM: CT ABDOMEN AND PELVIS WITHOUT CONTRAST TECHNIQUE: Multidetector CT imaging of the abdomen and pelvis was performed following the standard protocol without IV contrast. Oral contrast not administered for this indication. Sagittal and coronal MPR images reconstructed from axial data set. COMPARISON:  11/05/2007 CT abdomen, CT abdomen and pelvis 04/14/2004 intermediate to high attenuation mass at posteroinferior bladder FINDINGS: Lower chest: Peribronchial thickening and volume loss in the lower lobes. Calcified granuloma RIGHT lower lobe. Hepatobiliary: Small cyst lateral segment LEFT lobe liver unchanged. Calcified granuloma anterior liver. Gallbladder surgically absent. No additional hepatic abnormalities. Pancreas: Normal appearance Spleen: Normal appearance Adrenals/Urinary Tract: Indeterminate LEFT adrenal nodule 2.5 x 2.2 cm image 17, unchanged. Probable small  cyst LEFT kidney 10 mm diameter slightly increased. Questionable small cyst 12 mm mid LEFT kidney image 24. Kidneys and ureters otherwise unremarkable. Intermediate attenuation "mass" at inferior bladder posteriorly, 4.8 x 3.5 x 2.0 cm, question clot versus tumor, not felt to be related to prostate gland. No urinary tract calcification or dilatation seen. Stomach/Bowel: Normal appendix. Mild sigmoid diverticulosis without evidence of diverticulitis. Gastric wall appears thickened though this may be an artifact related to collapsed state. Remaining bowel loops unremarkable. Vascular/Lymphatic: Minimal atherosclerotic calcification aorta and iliac arteries. Aorta normal caliber. No adenopathy. Reproductive: Minimal prostatic enlargement. Other: No free air or free fluid.  No acute inflammatory process. Musculoskeletal: Unremarkable IMPRESSION: Intermediate attenuation "mass" at inferior bladder posteriorly 4.8 x 3.5 x 2.0 cm, question clot versus tumor, not felt to be related to prostate gland; recommend correlation with cystoscopy. Stable LEFT adrenal nodule 2.5 x 2.2 cm. Probable small renal cysts. Mild sigmoid diverticulosis without evidence of diverticulitis. Bronchitic changes and volume loss in the lower lobes. Aortic Atherosclerosis (ICD10-I70.0). Electronically Signed   By: Lavonia Dana M.D.   On: 07/02/2020 12:24    Procedures Procedures (including critical care time)  Medications Ordered in ED Medications  cefTRIAXone (ROCEPHIN) injection 1 g (has no administration in time range)    ED Course  I have reviewed the triage vital signs and the nursing notes.  Pertinent labs & imaging results that were available during my care of the patient were reviewed by me and considered in my medical decision making (see chart for details).    MDM Rules/Calculators/A&P                          Patient is presenting to the ED with complaints of hematuria since this morning.    Labs UA: Red, turbid,  gross hematuria.  Greater than  50 RBCs, also has 21-50 WBC and many bacteria. CBC: Mild anemia with hemoglobin of 11.7, last labs obtained demonstrated mildly improved hemoglobin of 12.9.  No leukocytosis concerning for infection. CMP: Largely unremarkable.  Mild increase in creatinine and decrease in GFR, however unsure of chronicity.  No electrolyte derangement.  Imaging I personally reviewed CT obtained of abdomen and pelvis without contrast to evaluate for obstructing kidney stone or obvious mass/tumor.  There appears to be an intermediate attenuation "mass" at the inferior bladder posteriorly, question for clot versus tumor.  Not felt to be related to prostate gland.  Recommending cystoscopy follow-up.  Patient is hemodynamically stable here in the ED and in no acute distress.  He denies any fevers or symptoms of systemic illness.  No pain or tenderness.  Encouraging patient to return to the ED immediately should he develop any lightheadedness, dizziness, shortness of breath, inability to void, or any other new or worsening symptoms.  Will prescribe antibiotics given bacteria and WBC in his UA in addition to his hematuria.  All of the evaluation and work-up results were discussed with the patient and any family at bedside.  Patient and/or family were informed that while patient is appropriate for discharge at this time, some medical emergencies may only develop or become detectable after a period of time.  I specifically instructed patient and/or family to return to return to the ED or seek immediate medical attention for any new or worsening symptoms.  They were provided opportunity to ask any additional questions and have none at this time.  Prior to discharge patient is feeling well, agreeable with plan for discharge home.  They have expressed understanding of verbal discharge instructions as well as return precautions and are agreeable to the plan.   Final Clinical Impression(s) / ED  Diagnoses Final diagnoses:  Gross hematuria    Rx / DC Orders ED Discharge Orders         Ordered    Ambulatory referral to Urology        07/02/20 1255    ciprofloxacin (CIPRO) 500 MG tablet  Every 12 hours        07/02/20 1340           Reita Chard 07/02/20 1342    Davonna Belling, MD 07/02/20 1504

## 2020-07-04 LAB — URINE CULTURE: Culture: NO GROWTH

## 2020-07-06 ENCOUNTER — Encounter: Payer: Self-pay | Admitting: Urology

## 2020-07-06 ENCOUNTER — Other Ambulatory Visit: Payer: Self-pay

## 2020-07-06 ENCOUNTER — Ambulatory Visit (INDEPENDENT_AMBULATORY_CARE_PROVIDER_SITE_OTHER): Payer: Medicare Other | Admitting: Urology

## 2020-07-06 VITALS — BP 107/56 | HR 74 | Temp 97.7°F | Ht 66.0 in | Wt 180.0 lb

## 2020-07-06 DIAGNOSIS — R310 Gross hematuria: Secondary | ICD-10-CM | POA: Diagnosis not present

## 2020-07-06 LAB — MICROSCOPIC EXAMINATION
Bacteria, UA: NONE SEEN
Epithelial Cells (non renal): NONE SEEN /hpf (ref 0–10)
Renal Epithel, UA: NONE SEEN /hpf
WBC, UA: NONE SEEN /hpf (ref 0–5)

## 2020-07-06 LAB — URINALYSIS, ROUTINE W REFLEX MICROSCOPIC
Bilirubin, UA: NEGATIVE
Glucose, UA: NEGATIVE
Ketones, UA: NEGATIVE
Leukocytes,UA: NEGATIVE
Nitrite, UA: NEGATIVE
Protein,UA: NEGATIVE
Specific Gravity, UA: 1.01 (ref 1.005–1.030)
Urobilinogen, Ur: 0.2 mg/dL (ref 0.2–1.0)
pH, UA: 5 (ref 5.0–7.5)

## 2020-07-06 LAB — BLADDER SCAN AMB NON-IMAGING: Scan Result: 92.1

## 2020-07-06 MED ORDER — FINASTERIDE 5 MG PO TABS: 5.0000 mg | ORAL_TABLET | Freq: Every day | ORAL | 3 refills | Status: DC

## 2020-07-06 MED ORDER — CIPROFLOXACIN HCL 500 MG PO TABS
500.0000 mg | ORAL_TABLET | Freq: Once | ORAL | Status: AC
  Administered 2020-07-06: 500 mg via ORAL

## 2020-07-06 NOTE — Progress Notes (Signed)
07/06/2020 10:54 AM   Hewitt Blade Langlois [birth year] [id number]  Referring provider: Rosita Fire, MD [place],  [place] [address]  Gross hematuria  HPI: Mr Peel is a 86yo here for evaluation of gross hematuria. Last week he had gross painless hematuria and presented to the ER on 8/28. CT stone study shows a possible 5cm bladder mass. Urine culture showed no growth.  He smoked for 25 years and quit 15 years ago. Hematuria cleared after 3 days   PMH: Past Medical History:  Diagnosis Date  . Arthritis   . COPD (chronic obstructive pulmonary disease) ([place])   . Diabetes mellitus without complication ([place])   . GERD (gastroesophageal reflux disease)   . Heartburn   . Hypertension   . Pneumonia   . Renal cell carcinoma [facility])     Surgical History: Past Surgical History:  Procedure Laterality Date  . CHOLECYSTECTOMY    . ESOPHAGOGASTRODUODENOSCOPY N/A 08/07/2017   Dr. 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 small hiatal hernia, duodenum normal  . MALONEY DILATION N/A 08/07/2017   Procedure: Venia Minks DILATION;  Surgeon: Daneil Dolin, MD;  [facility];  Service: Endoscopy;  Laterality: N/A;  . right nephrectomy      Home Medications:  Allergies as of 07/06/2020      Reactions   Reglan [metoclopramide] Swelling, Other (See Comments)   Caused oral swelling-states that this medication was taken once or twice in the past and the reaction was the same each time      Medication List       Accurate as of July 06, 2020 10:54 AM. If you have any questions, ask your nurse or doctor.        Accu-Chek FastClix Lancets Misc   Accu-Chek Guide test strip Generic drug: glucose blood   albuterol 108 (90 Base) MCG/ACT inhaler Commonly known as: VENTOLIN HFA Inhale 2 puffs into the lungs every 6 (six) hours as needed for wheezing or  shortness of breath.   ciprofloxacin 500 MG tablet Commonly known as: CIPRO Take 1 tablet (500 mg total) by mouth every 12 (twelve) hours for 10 days.   Fluticasone-Salmeterol 250-50 MCG/DOSE Aepb Commonly known as: ADVAIR Inhale 1 puff into the lungs every 12 (twelve) hours.   lisinopril 20 MG tablet Commonly known as: ZESTRIL Take 20 mg by mouth daily.   lisinopril-hydrochlorothiazide 20-12.5 MG tablet Commonly known as: ZESTORETIC   meclizine 25 MG tablet Commonly known as: ANTIVERT Take 25 mg by mouth 4 (four) times daily as needed for dizziness.   metFORMIN 500 MG tablet Commonly known as: Glucophage Take 1 tablet (500 mg total) by mouth 2 (two) times daily with a meal.   naproxen sodium 220 MG tablet Commonly known as: ALEVE Take 220 mg by mouth daily as needed (pain).   omeprazole 20 MG capsule Commonly known as: PRILOSEC Take 20 mg by mouth daily as needed (for acid reflux).   Simbrinza 1-0.2 % Susp Generic drug: Brinzolamide-Brimonidine Place 1 drop into both eyes 2 (two) times daily.   Vyzulta 0.024 % Soln Generic drug: Latanoprostene Bunod Place 1 drop into both eyes at bedtime.       Allergies:  Allergies  Allergen Reactions  . Reglan [Metoclopramide] Swelling and Other (See Comments)    Caused oral swelling-states that this medication was taken once or twice in the past and the reaction was  the same each time    Family History: Family History  Problem Relation Age of Onset  . Colon cancer Neg Hx   . Colon polyps Neg Hx     Social History:  reports that he quit smoking about 15 years ago. He quit after 25.00 years of use. He has quit using smokeless tobacco. He reports that he does not drink alcohol and does not use drugs.  ROS: All other review of systems were reviewed and are negative except what is noted above in HPI  Physical Exam: BP (!) 107/56   Pulse 74   Temp 97.7 F (36.5 C)   Ht 5\' 6"  (1.676 m)   Wt 180 lb (81.6 kg)   BMI  29.05 kg/m   Constitutional:  Alert and oriented, No acute distress. HEENT: [place] AT, moist mucus membranes.  Trachea midline, no masses. Cardiovascular: No clubbing, cyanosis, or edema. Respiratory: Normal respiratory effort, no increased work of breathing. GI: Abdomen is soft, nontender, nondistended, no abdominal masses GU: No CVA tenderness. Circumcised phallus. No masses/lesions on penis, testis, scrotum. Prostate 40g smooth no nodules no induration.  Lymph: No cervical or inguinal lymphadenopathy. Skin: No rashes, bruises or suspicious lesions. Neurologic: Grossly intact, no focal deficits, moving all 4 extremities. Psychiatric: Normal mood and affect.  Laboratory Data: Lab Results  Component Value Date   WBC 6.1 07/02/2020   HGB 11.7 (L) 07/02/2020   HCT 38.1 (L) 07/02/2020   MCV 90.5 07/02/2020   PLT 150 07/02/2020    Lab Results  Component Value Date   CREATININE 1.35 (H) 07/02/2020    No results found for: PSA  No results found for: TESTOSTERONE  Lab Results  Component Value Date   HGBA1C 6.4 (H) 09/08/2011    Urinalysis    Component Value Date/Time   COLORURINE RED (A) 07/02/2020 0957   APPEARANCEUR TURBID (A) 07/02/2020 0957   LABSPEC 1.015 07/02/2020 0957   PHURINE 7.0 07/02/2020 0957   GLUCOSEU (A) 07/02/2020 0957    TEST NOT REPORTED DUE TO COLOR INTERFERENCE OF URINE PIGMENT   HGBUR (A) 07/02/2020 0957    TEST NOT REPORTED DUE TO COLOR INTERFERENCE OF URINE PIGMENT   BILIRUBINUR (A) 07/02/2020 0957    TEST NOT REPORTED DUE TO COLOR INTERFERENCE OF URINE PIGMENT   KETONESUR (A) 07/02/2020 0957    TEST NOT REPORTED DUE TO COLOR INTERFERENCE OF URINE PIGMENT   PROTEINUR (A) 07/02/2020 0957    TEST NOT REPORTED DUE TO COLOR INTERFERENCE OF URINE PIGMENT   UROBILINOGEN 0.2 09/08/2011 0730   NITRITE (A) 07/02/2020 0957    TEST NOT REPORTED DUE TO COLOR INTERFERENCE OF URINE PIGMENT   LEUKOCYTESUR (A) 07/02/2020 0957    TEST NOT REPORTED DUE TO COLOR  INTERFERENCE OF URINE PIGMENT    Lab Results  Component Value Date   BACTERIA MANY (A) 07/02/2020    Pertinent Imaging: CT 8/28: Images reviewed and discussed with the patient Results for orders placed in visit on 06/17/02  DG Abd 1 View  Narrative FINDINGS CLINICAL DATA:  ABDOMINAL PAIN. ABDOMEN 1 VIEW NO URINARY TRACT CALCIFICATION.  NORMAL GAS PATTERN WITHOUT SIGNS OF BOWEL DILATATION OR OBSTRUCTION.  SKELETAL STRUCTUES UNREMARKABLE.  SI JOINTS PRESERVED. IMPRESSION NO ACUTE ABNORMALITIES.  No results found for this or any previous visit.  No results found for this or any previous visit.  No results found for this or any previous visit.  No results found for this or any previous visit.  No results found for  this or any previous visit.  No results found for this or any previous visit.  Results for orders placed during the hospital encounter of 07/02/20  CT Renal Stone Study  Narrative CLINICAL DATA:  Hematuria noticed this morning, unknown cause, denies pain  EXAM: CT ABDOMEN AND PELVIS WITHOUT CONTRAST  TECHNIQUE: Multidetector CT imaging of the abdomen and pelvis was performed following the standard protocol without IV contrast. Oral contrast not administered for this indication. Sagittal and coronal MPR images reconstructed from axial data set.  COMPARISON:  11/05/2007 CT abdomen, CT abdomen and pelvis 04/14/2004 intermediate to high attenuation mass at posteroinferior bladder  FINDINGS: Lower chest: Peribronchial thickening and volume loss in the lower lobes. Calcified granuloma RIGHT lower lobe.  Hepatobiliary: Small cyst lateral segment LEFT lobe liver unchanged. Calcified granuloma anterior liver. Gallbladder surgically absent. No additional hepatic abnormalities.  Pancreas: Normal appearance  Spleen: Normal appearance  Adrenals/Urinary Tract: Indeterminate LEFT adrenal nodule 2.5 x 2.2 cm image 17, unchanged. Probable small cyst LEFT  kidney 10 mm diameter slightly increased. Questionable small cyst 12 mm mid LEFT kidney image 24. Kidneys and ureters otherwise unremarkable. Intermediate attenuation "mass" at inferior bladder posteriorly, 4.8 x 3.5 x 2.0 cm, question clot versus tumor, not felt to be related to prostate gland. No urinary tract calcification or dilatation seen.  Stomach/Bowel: Normal appendix. Mild sigmoid diverticulosis without evidence of diverticulitis. Gastric wall appears thickened though this may be an artifact related to collapsed state. Remaining bowel loops unremarkable.  Vascular/Lymphatic: Minimal atherosclerotic calcification aorta and iliac arteries. Aorta normal caliber. No adenopathy.  Reproductive: Minimal prostatic enlargement.  Other: No free air or free fluid.  No acute inflammatory process.  Musculoskeletal: Unremarkable  IMPRESSION: Intermediate attenuation "mass" at inferior bladder posteriorly 4.8 x 3.5 x 2.0 cm, question clot versus tumor, not felt to be related to prostate gland; recommend correlation with cystoscopy.  Stable LEFT adrenal nodule 2.5 x 2.2 cm.  Probable small renal cysts.  Mild sigmoid diverticulosis without evidence of diverticulitis.  Bronchitic changes and volume loss in the lower lobes.  Aortic Atherosclerosis (ICD10-I70.0).   Electronically Signed By: Lavonia Dana M.D. On: 07/02/2020 12:24     Cystoscopy Procedure Note  Patient identification was confirmed, informed consent was obtained, and patient was prepped using Betadine solution.  Lidocaine jelly was administered per urethral meatus.     Pre-Procedure: - Inspection reveals a normal caliber ureteral meatus.  Procedure: The flexible cystoscope was introduced without difficulty - No urethral strictures/lesions are present. - Previous TURP with regrowth of right lateral lobe tissue prostate  - Normal bladder neck - Bilateral ureteral orifices identified - Bladder mucosa   reveals no ulcers, tumors, or lesions - No bladder stones - No trabeculation  Retroflexion shows no intravesical prostatic protrusion   Post-Procedure: - Patient tolerated the procedure well    Nicolette Bang, MD   Assessment & Plan:    1. Gross hematuria -Likely related to prostatic regrowth. We will start finasteride 5mg  daily. RTC 6 months with UA - Urinalysis, Routine w reflex microscopic - BLADDER SCAN AMB NON-IMAGING   No follow-ups on file.  Nicolette Bang, MD  [facility] [place]

## 2020-07-06 NOTE — Progress Notes (Signed)
Urological Symptom Review  Patient is experiencing the following symptoms: Frequent urination Hard to postpone urination Burning/pain with urination Leakage of urine Blood in urine Urinary tract infection Weak stream Erection problems (male only)   Review of Systems  Gastrointestinal (upper)  : Indigestion/heartburn  Gastrointestinal (lower) : Constipation  Constitutional : Negative for symptoms  Skin: Negative for skin symptoms  Eyes: Blurred vision  Ear/Nose/Throat : Negative for Ear/Nose/Throat symptoms  Hematologic/Lymphatic: Negative for Hematologic/Lymphatic symptoms  Cardiovascular : Negative for cardiovascular symptoms  Respiratory : Shortness of breath  Endocrine: Negative for endocrine symptoms  Musculoskeletal: Joint pain  Neurological: Dizziness  Psychologic: Negative for psychiatric symptoms

## 2020-07-06 NOTE — Patient Instructions (Signed)

## 2020-07-08 DIAGNOSIS — E1165 Type 2 diabetes mellitus with hyperglycemia: Secondary | ICD-10-CM | POA: Diagnosis not present

## 2020-07-08 DIAGNOSIS — M170 Bilateral primary osteoarthritis of knee: Secondary | ICD-10-CM | POA: Diagnosis not present

## 2020-08-07 DIAGNOSIS — M170 Bilateral primary osteoarthritis of knee: Secondary | ICD-10-CM | POA: Diagnosis not present

## 2020-08-07 DIAGNOSIS — I10 Essential (primary) hypertension: Secondary | ICD-10-CM | POA: Diagnosis not present

## 2020-08-08 DIAGNOSIS — Z23 Encounter for immunization: Secondary | ICD-10-CM | POA: Diagnosis not present

## 2020-08-22 DIAGNOSIS — M79672 Pain in left foot: Secondary | ICD-10-CM | POA: Diagnosis not present

## 2020-08-22 DIAGNOSIS — M79671 Pain in right foot: Secondary | ICD-10-CM | POA: Diagnosis not present

## 2020-08-22 DIAGNOSIS — I739 Peripheral vascular disease, unspecified: Secondary | ICD-10-CM | POA: Diagnosis not present

## 2020-08-22 DIAGNOSIS — E1140 Type 2 diabetes mellitus with diabetic neuropathy, unspecified: Secondary | ICD-10-CM | POA: Diagnosis not present

## 2020-08-22 DIAGNOSIS — L110 Acquired keratosis follicularis: Secondary | ICD-10-CM | POA: Diagnosis not present

## 2020-08-30 DIAGNOSIS — H401112 Primary open-angle glaucoma, right eye, moderate stage: Secondary | ICD-10-CM | POA: Diagnosis not present

## 2020-09-07 DIAGNOSIS — E1165 Type 2 diabetes mellitus with hyperglycemia: Secondary | ICD-10-CM | POA: Diagnosis not present

## 2020-09-07 DIAGNOSIS — I10 Essential (primary) hypertension: Secondary | ICD-10-CM | POA: Diagnosis not present

## 2020-10-08 DIAGNOSIS — E1165 Type 2 diabetes mellitus with hyperglycemia: Secondary | ICD-10-CM | POA: Diagnosis not present

## 2020-10-08 DIAGNOSIS — M170 Bilateral primary osteoarthritis of knee: Secondary | ICD-10-CM | POA: Diagnosis not present

## 2020-11-14 DIAGNOSIS — E1140 Type 2 diabetes mellitus with diabetic neuropathy, unspecified: Secondary | ICD-10-CM | POA: Diagnosis not present

## 2020-11-14 DIAGNOSIS — I739 Peripheral vascular disease, unspecified: Secondary | ICD-10-CM | POA: Diagnosis not present

## 2020-11-14 DIAGNOSIS — L110 Acquired keratosis follicularis: Secondary | ICD-10-CM | POA: Diagnosis not present

## 2020-11-14 DIAGNOSIS — M79671 Pain in right foot: Secondary | ICD-10-CM | POA: Diagnosis not present

## 2020-11-14 DIAGNOSIS — M79672 Pain in left foot: Secondary | ICD-10-CM | POA: Diagnosis not present

## 2020-12-13 DIAGNOSIS — J449 Chronic obstructive pulmonary disease, unspecified: Secondary | ICD-10-CM | POA: Diagnosis not present

## 2020-12-13 DIAGNOSIS — I10 Essential (primary) hypertension: Secondary | ICD-10-CM | POA: Diagnosis not present

## 2020-12-13 DIAGNOSIS — Z0001 Encounter for general adult medical examination with abnormal findings: Secondary | ICD-10-CM | POA: Diagnosis not present

## 2020-12-13 DIAGNOSIS — E1142 Type 2 diabetes mellitus with diabetic polyneuropathy: Secondary | ICD-10-CM | POA: Diagnosis not present

## 2020-12-13 DIAGNOSIS — Z1389 Encounter for screening for other disorder: Secondary | ICD-10-CM | POA: Diagnosis not present

## 2020-12-14 DIAGNOSIS — I10 Essential (primary) hypertension: Secondary | ICD-10-CM | POA: Diagnosis not present

## 2020-12-14 DIAGNOSIS — E1142 Type 2 diabetes mellitus with diabetic polyneuropathy: Secondary | ICD-10-CM | POA: Diagnosis not present

## 2020-12-14 DIAGNOSIS — J449 Chronic obstructive pulmonary disease, unspecified: Secondary | ICD-10-CM | POA: Diagnosis not present

## 2020-12-27 DIAGNOSIS — E119 Type 2 diabetes mellitus without complications: Secondary | ICD-10-CM | POA: Diagnosis not present

## 2020-12-27 DIAGNOSIS — H5213 Myopia, bilateral: Secondary | ICD-10-CM | POA: Diagnosis not present

## 2021-01-05 ENCOUNTER — Ambulatory Visit: Payer: Medicare Other | Admitting: Urology

## 2021-01-10 DIAGNOSIS — M170 Bilateral primary osteoarthritis of knee: Secondary | ICD-10-CM | POA: Diagnosis not present

## 2021-01-10 DIAGNOSIS — E1165 Type 2 diabetes mellitus with hyperglycemia: Secondary | ICD-10-CM | POA: Diagnosis not present

## 2021-01-11 ENCOUNTER — Ambulatory Visit (INDEPENDENT_AMBULATORY_CARE_PROVIDER_SITE_OTHER): Payer: Medicare Other | Admitting: Urology

## 2021-01-11 ENCOUNTER — Other Ambulatory Visit: Payer: Self-pay

## 2021-01-11 ENCOUNTER — Encounter: Payer: Self-pay | Admitting: Urology

## 2021-01-11 VITALS — BP 115/69 | HR 67 | Temp 98.6°F | Ht 66.0 in | Wt 180.0 lb

## 2021-01-11 DIAGNOSIS — R310 Gross hematuria: Secondary | ICD-10-CM | POA: Diagnosis not present

## 2021-01-11 LAB — URINALYSIS, ROUTINE W REFLEX MICROSCOPIC
Appearance Ur: NEGATIVE
Bilirubin, UA: NEGATIVE
Color, UA: NEGATIVE
Glucose, UA: NEGATIVE
Ketones, UA: NEGATIVE
Leukocytes,UA: NEGATIVE
Nitrite, UA: NEGATIVE
Protein,UA: NEGATIVE
RBC, UA: NEGATIVE
Specific Gravity, UA: 1.015 (ref 1.005–1.030)
Urobilinogen, Ur: 0.2 mg/dL (ref 0.2–1.0)
pH, UA: 6 (ref 5.0–7.5)

## 2021-01-11 MED ORDER — FINASTERIDE 5 MG PO TABS
5.0000 mg | ORAL_TABLET | Freq: Every day | ORAL | 3 refills | Status: DC
Start: 2021-01-11 — End: 2022-01-15

## 2021-01-11 NOTE — Progress Notes (Signed)
01/11/2021 10:52 AM   Hayden Green [birth year] [id number]  Referring provider: Rosita Fire, MD [place],  [place] [id number]  followup gross hematuria  HPI: Hayden Green is a 87yo here for followup for gross hematuria. No hematuria since last visit. He is on finasteride 5mg  daily. IPSS 11 with QOL 2. Nocturia 2-4x based on fluid management. NO other complaints today   PMH: Past Medical History:  Diagnosis Date  . Arthritis   . COPD (chronic obstructive pulmonary disease) ([place])   . Diabetes mellitus without complication ([place])   . GERD (gastroesophageal reflux disease)   . Heartburn   . Hypertension   . Pneumonia   . Renal cell carcinoma [facility])     Surgical History: Past Surgical History:  Procedure Laterality Date  . CHOLECYSTECTOMY    . ESOPHAGOGASTRODUODENOSCOPY N/A 08/07/2017   Dr. 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 small hiatal hernia, duodenum normal  . MALONEY DILATION N/A 08/07/2017   Procedure: Venia Minks DILATION;  Surgeon: Daneil Dolin, MD;  [facility];  Service: Endoscopy;  Laterality: N/A;  . right nephrectomy      Home Medications:  Allergies as of 01/11/2021      Reactions   Reglan [metoclopramide] Swelling, Other (See Comments)   Caused oral swelling-states that this medication was taken once or twice in the past and the reaction was the same each time      Medication List       Accurate as of January 11, 2021 10:52 AM. If you have any questions, ask your nurse or doctor.        Accu-Chek FastClix Lancets Misc   Accu-Chek Guide test strip Generic drug: glucose blood   albuterol 108 (90 Base) MCG/ACT inhaler Commonly known as: VENTOLIN HFA Inhale 2 puffs into the lungs every 6 (six) hours as needed for wheezing or shortness of breath.   finasteride 5 MG tablet Commonly known as: PROSCAR Take 1  tablet (5 mg total) by mouth daily.   Fluticasone-Salmeterol 250-50 MCG/DOSE Aepb Commonly known as: ADVAIR Inhale 1 puff into the lungs every 12 (twelve) hours.   lisinopril 20 MG tablet Commonly known as: ZESTRIL Take 20 mg by mouth daily.   lisinopril-hydrochlorothiazide 20-12.5 MG tablet Commonly known as: ZESTORETIC   meclizine 25 MG tablet Commonly known as: ANTIVERT Take 25 mg by mouth 4 (four) times daily as needed for dizziness.   metFORMIN 500 MG tablet Commonly known as: Glucophage Take 1 tablet (500 mg total) by mouth 2 (two) times daily with a meal.   naproxen sodium 220 MG tablet Commonly known as: ALEVE Take 220 mg by mouth daily as needed (pain).   omeprazole 20 MG capsule Commonly known as: PRILOSEC Take 20 mg by mouth daily as needed (for acid reflux).   Simbrinza 1-0.2 % Susp Generic drug: Brinzolamide-Brimonidine Place 1 drop into both eyes 2 (two) times daily.   Vyzulta 0.024 % Soln Generic drug: Latanoprostene Bunod Place 1 drop into both eyes at bedtime.       Allergies:  Allergies  Allergen Reactions  . Reglan [Metoclopramide] Swelling and Other (See Comments)    Caused oral swelling-states that this medication was taken once or twice in the past and the reaction was the same each time    Family History: Family History  Problem Relation Age of Onset  . Colon cancer Neg Hx   .  Colon polyps Neg Hx     Social History:  reports that he quit smoking about 15 years ago. He quit after 25.00 years of use. He has quit using smokeless tobacco. He reports that he does not drink alcohol and does not use drugs.  ROS: All other review of systems were reviewed and are negative except what is noted above in HPI  Physical Exam: BP 115/69   Pulse 67   Temp 98.6 F (37 C)   Ht 5\' 6"  (1.676 m)   Wt 180 lb (81.6 kg)   BMI 29.05 kg/m   Constitutional:  Alert and oriented, No acute distress. HEENT: [place] AT, moist mucus membranes.  Trachea midline,  no masses. Cardiovascular: No clubbing, cyanosis, or edema. Respiratory: Normal respiratory effort, no increased work of breathing. GI: Abdomen is soft, nontender, nondistended, no abdominal masses GU: No CVA tenderness.  Lymph: No cervical or inguinal lymphadenopathy. Skin: No rashes, bruises or suspicious lesions. Neurologic: Grossly intact, no focal deficits, moving all 4 extremities. Psychiatric: Normal mood and affect.  Laboratory Data: Lab Results  Component Value Date   WBC 6.1 07/02/2020   HGB 11.7 (L) 07/02/2020   HCT 38.1 (L) 07/02/2020   MCV 90.5 07/02/2020   PLT 150 07/02/2020    Lab Results  Component Value Date   CREATININE 1.35 (H) 07/02/2020    No results found for: PSA  No results found for: TESTOSTERONE  Lab Results  Component Value Date   HGBA1C 6.4 (H) 09/08/2011    Urinalysis    Component Value Date/Time   COLORURINE RED (A) 07/02/2020 0957   APPEARANCEUR Clear 07/06/2020 1011   LABSPEC 1.015 07/02/2020 0957   PHURINE 7.0 07/02/2020 0957   GLUCOSEU Negative 07/06/2020 1011   HGBUR (A) 07/02/2020 0957    TEST NOT REPORTED DUE TO COLOR INTERFERENCE OF URINE PIGMENT   BILIRUBINUR Negative 07/06/2020 1011   KETONESUR (A) 07/02/2020 0957    TEST NOT REPORTED DUE TO COLOR INTERFERENCE OF URINE PIGMENT   PROTEINUR Negative 07/06/2020 1011   PROTEINUR (A) 07/02/2020 0957    TEST NOT REPORTED DUE TO COLOR INTERFERENCE OF URINE PIGMENT   UROBILINOGEN 0.2 09/08/2011 0730   NITRITE Negative 07/06/2020 1011   NITRITE (A) 07/02/2020 0957    TEST NOT REPORTED DUE TO COLOR INTERFERENCE OF URINE PIGMENT   LEUKOCYTESUR Negative 07/06/2020 1011   LEUKOCYTESUR (A) 07/02/2020 0957    TEST NOT REPORTED DUE TO COLOR INTERFERENCE OF URINE PIGMENT    Lab Results  Component Value Date   LABMICR See below: 07/06/2020   WBCUA None seen 07/06/2020   LABEPIT None seen 07/06/2020   BACTERIA None seen 07/06/2020    Pertinent Imaging:  Results for orders  placed in visit on 06/17/02  DG Abd 1 View  Narrative FINDINGS CLINICAL DATA:  ABDOMINAL PAIN. ABDOMEN 1 VIEW NO URINARY TRACT CALCIFICATION.  NORMAL GAS PATTERN WITHOUT SIGNS OF BOWEL DILATATION OR OBSTRUCTION.  SKELETAL STRUCTUES UNREMARKABLE.  SI JOINTS PRESERVED. IMPRESSION NO ACUTE ABNORMALITIES.  No results found for this or any previous visit.  No results found for this or any previous visit.  No results found for this or any previous visit.  No results found for this or any previous visit.  No results found for this or any previous visit.  No results found for this or any previous visit.  Results for orders placed during the hospital encounter of 07/02/20  CT Renal Stone Study  Narrative CLINICAL DATA:  Hematuria noticed this morning, unknown  cause, denies pain  EXAM: CT ABDOMEN AND PELVIS WITHOUT CONTRAST  TECHNIQUE: Multidetector CT imaging of the abdomen and pelvis was performed following the standard protocol without IV contrast. Oral contrast not administered for this indication. Sagittal and coronal MPR images reconstructed from axial data set.  COMPARISON:  11/05/2007 CT abdomen, CT abdomen and pelvis 04/14/2004 intermediate to high attenuation mass at posteroinferior bladder  FINDINGS: Lower chest: Peribronchial thickening and volume loss in the lower lobes. Calcified granuloma RIGHT lower lobe.  Hepatobiliary: Small cyst lateral segment LEFT lobe liver unchanged. Calcified granuloma anterior liver. Gallbladder surgically absent. No additional hepatic abnormalities.  Pancreas: Normal appearance  Spleen: Normal appearance  Adrenals/Urinary Tract: Indeterminate LEFT adrenal nodule 2.5 x 2.2 cm image 17, unchanged. Probable small cyst LEFT kidney 10 mm diameter slightly increased. Questionable small cyst 12 mm mid LEFT kidney image 24. Kidneys and ureters otherwise unremarkable. Intermediate attenuation "mass" at inferior bladder posteriorly,  4.8 x 3.5 x 2.0 cm, question clot versus tumor, not felt to be related to prostate gland. No urinary tract calcification or dilatation seen.  Stomach/Bowel: Normal appendix. Mild sigmoid diverticulosis without evidence of diverticulitis. Gastric wall appears thickened though this may be an artifact related to collapsed state. Remaining bowel loops unremarkable.  Vascular/Lymphatic: Minimal atherosclerotic calcification aorta and iliac arteries. Aorta normal caliber. No adenopathy.  Reproductive: Minimal prostatic enlargement.  Other: No free air or free fluid.  No acute inflammatory process.  Musculoskeletal: Unremarkable  IMPRESSION: Intermediate attenuation "mass" at inferior bladder posteriorly 4.8 x 3.5 x 2.0 cm, question clot versus tumor, not felt to be related to prostate gland; recommend correlation with cystoscopy.  Stable LEFT adrenal nodule 2.5 x 2.2 cm.  Probable small renal cysts.  Mild sigmoid diverticulosis without evidence of diverticulitis.  Bronchitic changes and volume loss in the lower lobes.  Aortic Atherosclerosis (ICD10-I70.0).   Electronically Signed By: Lavonia Dana M.D. On: 07/02/2020 12:24   Assessment & Plan:    1. Gross hematuria -continue finasteride 5mg  daily. RTC 1 year - Urinalysis, Routine w reflex microscopic   No follow-ups on file.  Nicolette Bang, MD  [facility] [place]

## 2021-01-11 NOTE — Progress Notes (Signed)
Urological Symptom Review  Patient is experiencing the following symptoms: Hard to postpone urination Burning/pain with urination Erection problems (male only)   Review of Systems  Gastrointestinal (upper)  : Negative for upper GI symptoms  Gastrointestinal (lower) : Negative for lower GI symptoms  Constitutional : Negative for symptoms  Skin: Negative for skin symptoms  Eyes: Negative for eye symptoms  Ear/Nose/Throat : Negative for Ear/Nose/Throat symptoms  Hematologic/Lymphatic: Negative for Hematologic/Lymphatic symptoms  Cardiovascular : Negative for cardiovascular symptoms  Respiratory : Shortness of breath  Endocrine: Negative for endocrine symptoms  Musculoskeletal: Negative for musculoskeletal symptoms  Neurological: Dizziness  Psychologic: Negative for psychiatric symptoms

## 2021-01-11 NOTE — Patient Instructions (Signed)

## 2021-01-30 DIAGNOSIS — M79671 Pain in right foot: Secondary | ICD-10-CM | POA: Diagnosis not present

## 2021-01-30 DIAGNOSIS — M79672 Pain in left foot: Secondary | ICD-10-CM | POA: Diagnosis not present

## 2021-01-30 DIAGNOSIS — L110 Acquired keratosis follicularis: Secondary | ICD-10-CM | POA: Diagnosis not present

## 2021-01-30 DIAGNOSIS — I739 Peripheral vascular disease, unspecified: Secondary | ICD-10-CM | POA: Diagnosis not present

## 2021-01-30 DIAGNOSIS — E1140 Type 2 diabetes mellitus with diabetic neuropathy, unspecified: Secondary | ICD-10-CM | POA: Diagnosis not present

## 2021-02-10 DIAGNOSIS — E1165 Type 2 diabetes mellitus with hyperglycemia: Secondary | ICD-10-CM | POA: Diagnosis not present

## 2021-02-10 DIAGNOSIS — I10 Essential (primary) hypertension: Secondary | ICD-10-CM | POA: Diagnosis not present

## 2021-03-12 DIAGNOSIS — E1165 Type 2 diabetes mellitus with hyperglycemia: Secondary | ICD-10-CM | POA: Diagnosis not present

## 2021-03-12 DIAGNOSIS — I10 Essential (primary) hypertension: Secondary | ICD-10-CM | POA: Diagnosis not present

## 2021-04-18 DIAGNOSIS — I10 Essential (primary) hypertension: Secondary | ICD-10-CM | POA: Diagnosis not present

## 2021-04-18 DIAGNOSIS — L84 Corns and callosities: Secondary | ICD-10-CM | POA: Diagnosis not present

## 2021-04-18 DIAGNOSIS — E1142 Type 2 diabetes mellitus with diabetic polyneuropathy: Secondary | ICD-10-CM | POA: Diagnosis not present

## 2021-04-24 DIAGNOSIS — I739 Peripheral vascular disease, unspecified: Secondary | ICD-10-CM | POA: Diagnosis not present

## 2021-04-24 DIAGNOSIS — M79671 Pain in right foot: Secondary | ICD-10-CM | POA: Diagnosis not present

## 2021-04-24 DIAGNOSIS — M79672 Pain in left foot: Secondary | ICD-10-CM | POA: Diagnosis not present

## 2021-04-24 DIAGNOSIS — L110 Acquired keratosis follicularis: Secondary | ICD-10-CM | POA: Diagnosis not present

## 2021-04-24 DIAGNOSIS — E1140 Type 2 diabetes mellitus with diabetic neuropathy, unspecified: Secondary | ICD-10-CM | POA: Diagnosis not present

## 2021-05-24 ENCOUNTER — Other Ambulatory Visit: Payer: Self-pay

## 2021-05-24 ENCOUNTER — Inpatient Hospital Stay (HOSPITAL_COMMUNITY)
Admission: EM | Admit: 2021-05-24 | Discharge: 2021-05-29 | DRG: 481 | Disposition: A | Discharge status: Skilled Nursing Facility | Payer: Medicare Other | Attending: Internal Medicine | Admitting: Internal Medicine

## 2021-05-24 ENCOUNTER — Encounter (HOSPITAL_COMMUNITY): Payer: Self-pay | Admitting: Family Medicine

## 2021-05-24 ENCOUNTER — Emergency Department (HOSPITAL_COMMUNITY): Payer: Medicare Other

## 2021-05-24 DIAGNOSIS — E1165 Type 2 diabetes mellitus with hyperglycemia: Secondary | ICD-10-CM | POA: Diagnosis present

## 2021-05-24 DIAGNOSIS — H4010X0 Unspecified open-angle glaucoma, stage unspecified: Secondary | ICD-10-CM | POA: Diagnosis present

## 2021-05-24 DIAGNOSIS — D649 Anemia, unspecified: Secondary | ICD-10-CM | POA: Diagnosis not present

## 2021-05-24 DIAGNOSIS — I10 Essential (primary) hypertension: Secondary | ICD-10-CM | POA: Diagnosis present

## 2021-05-24 DIAGNOSIS — Z9181 History of falling: Secondary | ICD-10-CM | POA: Diagnosis not present

## 2021-05-24 DIAGNOSIS — R2681 Unsteadiness on feet: Secondary | ICD-10-CM | POA: Diagnosis present

## 2021-05-24 DIAGNOSIS — E876 Hypokalemia: Secondary | ICD-10-CM | POA: Diagnosis not present

## 2021-05-24 DIAGNOSIS — Z905 Acquired absence of kidney: Secondary | ICD-10-CM | POA: Diagnosis not present

## 2021-05-24 DIAGNOSIS — H401190 Primary open-angle glaucoma, unspecified eye, stage unspecified: Secondary | ICD-10-CM | POA: Diagnosis not present

## 2021-05-24 DIAGNOSIS — Z20822 Contact with and (suspected) exposure to covid-19: Secondary | ICD-10-CM | POA: Diagnosis present

## 2021-05-24 DIAGNOSIS — D62 Acute posthemorrhagic anemia: Secondary | ICD-10-CM | POA: Diagnosis not present

## 2021-05-24 DIAGNOSIS — Z8701 Personal history of pneumonia (recurrent): Secondary | ICD-10-CM

## 2021-05-24 DIAGNOSIS — Z7984 Long term (current) use of oral hypoglycemic drugs: Secondary | ICD-10-CM

## 2021-05-24 DIAGNOSIS — E119 Type 2 diabetes mellitus without complications: Secondary | ICD-10-CM

## 2021-05-24 DIAGNOSIS — R739 Hyperglycemia, unspecified: Secondary | ICD-10-CM | POA: Diagnosis not present

## 2021-05-24 DIAGNOSIS — Z79899 Other long term (current) drug therapy: Secondary | ICD-10-CM

## 2021-05-24 DIAGNOSIS — Z87891 Personal history of nicotine dependence: Secondary | ICD-10-CM

## 2021-05-24 DIAGNOSIS — Z743 Need for continuous supervision: Secondary | ICD-10-CM | POA: Diagnosis not present

## 2021-05-24 DIAGNOSIS — R2689 Other abnormalities of gait and mobility: Secondary | ICD-10-CM | POA: Diagnosis not present

## 2021-05-24 DIAGNOSIS — M6281 Muscle weakness (generalized): Secondary | ICD-10-CM | POA: Diagnosis not present

## 2021-05-24 DIAGNOSIS — S72142D Displaced intertrochanteric fracture of left femur, subsequent encounter for closed fracture with routine healing: Secondary | ICD-10-CM | POA: Diagnosis not present

## 2021-05-24 DIAGNOSIS — Z888 Allergy status to other drugs, medicaments and biological substances status: Secondary | ICD-10-CM | POA: Diagnosis not present

## 2021-05-24 DIAGNOSIS — S72142A Displaced intertrochanteric fracture of left femur, initial encounter for closed fracture: Secondary | ICD-10-CM | POA: Diagnosis not present

## 2021-05-24 DIAGNOSIS — S72002A Fracture of unspecified part of neck of left femur, initial encounter for closed fracture: Secondary | ICD-10-CM | POA: Diagnosis present

## 2021-05-24 DIAGNOSIS — J449 Chronic obstructive pulmonary disease, unspecified: Secondary | ICD-10-CM | POA: Diagnosis present

## 2021-05-24 DIAGNOSIS — K219 Gastro-esophageal reflux disease without esophagitis: Secondary | ICD-10-CM | POA: Diagnosis present

## 2021-05-24 DIAGNOSIS — E871 Hypo-osmolality and hyponatremia: Secondary | ICD-10-CM | POA: Diagnosis present

## 2021-05-24 DIAGNOSIS — R6889 Other general symptoms and signs: Secondary | ICD-10-CM | POA: Diagnosis not present

## 2021-05-24 DIAGNOSIS — Z85528 Personal history of other malignant neoplasm of kidney: Secondary | ICD-10-CM

## 2021-05-24 DIAGNOSIS — K5900 Constipation, unspecified: Secondary | ICD-10-CM | POA: Diagnosis not present

## 2021-05-24 DIAGNOSIS — M25552 Pain in left hip: Secondary | ICD-10-CM | POA: Diagnosis not present

## 2021-05-24 DIAGNOSIS — M1990 Unspecified osteoarthritis, unspecified site: Secondary | ICD-10-CM | POA: Diagnosis present

## 2021-05-24 DIAGNOSIS — Z419 Encounter for procedure for purposes other than remedying health state, unspecified: Secondary | ICD-10-CM

## 2021-05-24 DIAGNOSIS — Y92009 Unspecified place in unspecified non-institutional (private) residence as the place of occurrence of the external cause: Secondary | ICD-10-CM | POA: Diagnosis not present

## 2021-05-24 DIAGNOSIS — E11319 Type 2 diabetes mellitus with unspecified diabetic retinopathy without macular edema: Secondary | ICD-10-CM | POA: Diagnosis present

## 2021-05-24 DIAGNOSIS — R531 Weakness: Secondary | ICD-10-CM | POA: Diagnosis not present

## 2021-05-24 DIAGNOSIS — S72002D Fracture of unspecified part of neck of left femur, subsequent encounter for closed fracture with routine healing: Secondary | ICD-10-CM | POA: Diagnosis not present

## 2021-05-24 DIAGNOSIS — W11XXXA Fall on and from ladder, initial encounter: Secondary | ICD-10-CM | POA: Diagnosis present

## 2021-05-24 DIAGNOSIS — R1310 Dysphagia, unspecified: Secondary | ICD-10-CM | POA: Diagnosis not present

## 2021-05-24 DIAGNOSIS — W19XXXA Unspecified fall, initial encounter: Secondary | ICD-10-CM

## 2021-05-24 HISTORY — DX: Personal history of other malignant neoplasm of kidney: Z85.528

## 2021-05-24 HISTORY — DX: Anemia, unspecified: D64.9

## 2021-05-24 LAB — RESP PANEL BY RT-PCR (FLU A&B, COVID) ARPGX2
Influenza A by PCR: NEGATIVE
Influenza B by PCR: NEGATIVE
SARS Coronavirus 2 by RT PCR: NEGATIVE

## 2021-05-24 LAB — CBC WITH DIFFERENTIAL/PLATELET
Abs Immature Granulocytes: 0.07 10*3/uL (ref 0.00–0.07)
Basophils Absolute: 0 10*3/uL (ref 0.0–0.1)
Basophils Relative: 0 %
Eosinophils Absolute: 0.2 10*3/uL (ref 0.0–0.5)
Eosinophils Relative: 2 %
HCT: 36.2 % — ABNORMAL LOW (ref 39.0–52.0)
Hemoglobin: 11.3 g/dL — ABNORMAL LOW (ref 13.0–17.0)
Immature Granulocytes: 1 %
Lymphocytes Relative: 12 %
Lymphs Abs: 1 10*3/uL (ref 0.7–4.0)
MCH: 28.4 pg (ref 26.0–34.0)
MCHC: 31.2 g/dL (ref 30.0–36.0)
MCV: 91 fL (ref 80.0–100.0)
Monocytes Absolute: 0.5 10*3/uL (ref 0.1–1.0)
Monocytes Relative: 5 %
Neutro Abs: 7 10*3/uL (ref 1.7–7.7)
Neutrophils Relative %: 80 %
Platelets: 141 10*3/uL — ABNORMAL LOW (ref 150–400)
RBC: 3.98 MIL/uL — ABNORMAL LOW (ref 4.22–5.81)
RDW: 12.1 % (ref 11.5–15.5)
WBC: 8.8 10*3/uL (ref 4.0–10.5)
nRBC: 0 % (ref 0.0–0.2)

## 2021-05-24 LAB — COMPREHENSIVE METABOLIC PANEL
ALT: 11 U/L (ref 0–44)
AST: 22 U/L (ref 15–41)
Albumin: 4 g/dL (ref 3.5–5.0)
Alkaline Phosphatase: 67 U/L (ref 38–126)
Anion gap: 7 (ref 5–15)
BUN: 17 mg/dL (ref 8–23)
CO2: 26 mmol/L (ref 22–32)
Calcium: 9.6 mg/dL (ref 8.9–10.3)
Chloride: 101 mmol/L (ref 98–111)
Creatinine, Ser: 1.08 mg/dL (ref 0.61–1.24)
GFR, Estimated: >60 mL/min (ref >60)
Glucose, Bld: 149 mg/dL — ABNORMAL HIGH (ref 70–99)
Potassium: 3.6 mmol/L (ref 3.5–5.1)
Sodium: 134 mmol/L — ABNORMAL LOW (ref 135–145)
Total Bilirubin: 0.8 mg/dL (ref 0.3–1.2)
Total Protein: 6.5 g/dL (ref 6.5–8.1)

## 2021-05-24 LAB — TYPE AND SCREEN
ABO/RH(D): O POS
ABO/RH(D): O POS
Antibody Screen: NEGATIVE
Antibody Screen: NEGATIVE

## 2021-05-24 LAB — GLUCOSE, CAPILLARY
Glucose-Capillary: 119 mg/dL — ABNORMAL HIGH (ref 70–99)
Glucose-Capillary: 168 mg/dL — ABNORMAL HIGH (ref 70–99)

## 2021-05-24 LAB — HEMOGLOBIN A1C
Hgb A1c MFr Bld: 5.6 % (ref 4.8–5.6)
Mean Plasma Glucose: 114.02 mg/dL

## 2021-05-24 LAB — VITAMIN D 25 HYDROXY (VIT D DEFICIENCY, FRACTURES): Vit D, 25-Hydroxy: 17.31 ng/mL — ABNORMAL LOW (ref 30–100)

## 2021-05-24 LAB — SURGICAL PCR SCREEN
MRSA, PCR: NEGATIVE
Staphylococcus aureus: NEGATIVE

## 2021-05-24 LAB — ABO/RH: ABO/RH(D): O POS

## 2021-05-24 MED ORDER — POVIDONE-IODINE 10 % EX SWAB
2.0000 "application " | Freq: Once | CUTANEOUS | Status: AC
  Administered 2021-05-25: 2 via TOPICAL

## 2021-05-24 MED ORDER — SODIUM CHLORIDE 0.9 % IV SOLN
2.0000 g | INTRAVENOUS | Status: AC
  Administered 2021-05-25: 2 g via INTRAVENOUS
  Filled 2021-05-24: qty 2

## 2021-05-24 MED ORDER — HYDROCHLOROTHIAZIDE 12.5 MG PO CAPS
12.5000 mg | ORAL_CAPSULE | Freq: Every day | ORAL | Status: DC
  Administered 2021-05-24 – 2021-05-29 (×4): 12.5 mg via ORAL
  Filled 2021-05-24 (×5): qty 1

## 2021-05-24 MED ORDER — MECLIZINE HCL 25 MG PO TABS: 25.0000 mg | ORAL_TABLET | Freq: Four times a day (QID) | ORAL | Status: DC | PRN

## 2021-05-24 MED ORDER — HYDROCODONE-ACETAMINOPHEN 5-325 MG PO TABS
1.0000 | ORAL_TABLET | Freq: Four times a day (QID) | ORAL | Status: DC | PRN
  Administered 2021-05-24: 1 via ORAL
  Administered 2021-05-24: 2 via ORAL
  Filled 2021-05-24 (×2): qty 2

## 2021-05-24 MED ORDER — INSULIN ASPART 100 UNIT/ML IJ SOLN
0.0000 [IU] | Freq: Three times a day (TID) | INTRAMUSCULAR | Status: DC
  Administered 2021-05-25 – 2021-05-26 (×3): 2 [IU] via SUBCUTANEOUS
  Administered 2021-05-27: 1 [IU] via SUBCUTANEOUS
  Administered 2021-05-27: 2 [IU] via SUBCUTANEOUS
  Administered 2021-05-28: 1 [IU] via SUBCUTANEOUS
  Administered 2021-05-28 (×2): 2 [IU] via SUBCUTANEOUS
  Administered 2021-05-29: 1 [IU] via SUBCUTANEOUS
  Administered 2021-05-29: 3 [IU] via SUBCUTANEOUS

## 2021-05-24 MED ORDER — LISINOPRIL-HYDROCHLOROTHIAZIDE 20-12.5 MG PO TABS: 1.0000 | ORAL_TABLET | Freq: Every day | ORAL | Status: DC

## 2021-05-24 MED ORDER — CHLORHEXIDINE GLUCONATE 4 % EX LIQD
60.0000 mL | Freq: Once | CUTANEOUS | Status: AC
  Administered 2021-05-25: 4 via TOPICAL
  Filled 2021-05-24: qty 60

## 2021-05-24 MED ORDER — BISACODYL 5 MG PO TBEC: 5.0000 mg | DELAYED_RELEASE_TABLET | Freq: Every day | ORAL | Status: DC | PRN

## 2021-05-24 MED ORDER — TRANEXAMIC ACID-NACL 1000-0.7 MG/100ML-% IV SOLN: 1000.0000 mg | INTRAVENOUS | Status: DC

## 2021-05-24 MED ORDER — HEPARIN SODIUM (PORCINE) 5000 UNIT/ML IJ SOLN: 5000.0000 [IU] | Freq: Three times a day (TID) | INTRAMUSCULAR | Status: DC

## 2021-05-24 MED ORDER — LISINOPRIL 20 MG PO TABS
20.0000 mg | ORAL_TABLET | Freq: Every day | ORAL | Status: DC
  Administered 2021-05-24 – 2021-05-29 (×4): 20 mg via ORAL
  Filled 2021-05-24: qty 1
  Filled 2021-05-24: qty 2
  Filled 2021-05-24 (×3): qty 1

## 2021-05-24 MED ORDER — BRINZOLAMIDE 1 % OP SUSP
1.0000 [drp] | Freq: Three times a day (TID) | OPHTHALMIC | Status: DC
  Administered 2021-05-24 – 2021-05-29 (×9): 1 [drp] via OPHTHALMIC
  Filled 2021-05-24 (×2): qty 10

## 2021-05-24 MED ORDER — MUPIROCIN 2 % EX OINT
1.0000 | TOPICAL_OINTMENT | Freq: Two times a day (BID) | CUTANEOUS | Status: DC
Start: 2021-05-24 — End: 2021-05-26
  Administered 2021-05-24: 1 via NASAL
  Filled 2021-05-24: qty 22

## 2021-05-24 MED ORDER — BRIMONIDINE TARTRATE 0.2 % OP SOLN
1.0000 [drp] | Freq: Three times a day (TID) | OPHTHALMIC | Status: DC
  Administered 2021-05-25 – 2021-05-29 (×11): 1 [drp] via OPHTHALMIC
  Filled 2021-05-24 (×2): qty 5

## 2021-05-24 MED ORDER — SODIUM CHLORIDE 0.9 % IV SOLN: INTRAVENOUS | Status: DC

## 2021-05-24 MED ORDER — TRANEXAMIC ACID-NACL 1000-0.7 MG/100ML-% IV SOLN
1000.0000 mg | Freq: Once | INTRAVENOUS | Status: AC
  Administered 2021-05-24: 1000 mg via INTRAVENOUS
  Filled 2021-05-24 (×2): qty 100

## 2021-05-24 MED ORDER — LATANOPROST 0.005 % OP SOLN
1.0000 [drp] | Freq: Every day | OPHTHALMIC | Status: DC
  Administered 2021-05-25 – 2021-05-28 (×4): 1 [drp] via OPHTHALMIC
  Filled 2021-05-24: qty 2.5

## 2021-05-24 MED ORDER — POVIDONE-IODINE 10 % EX SWAB: 2.0000 "application " | Freq: Once | CUTANEOUS | Status: DC

## 2021-05-24 MED ORDER — SODIUM CHLORIDE 0.9 % IV SOLN: 2.0000 g | INTRAVENOUS | Status: DC

## 2021-05-24 MED ORDER — INSULIN ASPART 100 UNIT/ML IJ SOLN
2.0000 [IU] | Freq: Three times a day (TID) | INTRAMUSCULAR | Status: DC
  Administered 2021-05-25 – 2021-05-29 (×10): 2 [IU] via SUBCUTANEOUS

## 2021-05-24 MED ORDER — FINASTERIDE 5 MG PO TABS: 5.0000 mg | ORAL_TABLET | Freq: Every day | ORAL | Status: DC

## 2021-05-24 MED ORDER — HEPARIN SODIUM (PORCINE) 5000 UNIT/ML IJ SOLN
5000.0000 [IU] | Freq: Three times a day (TID) | INTRAMUSCULAR | Status: AC
  Administered 2021-05-24: 5000 [IU] via SUBCUTANEOUS
  Filled 2021-05-24: qty 1

## 2021-05-24 MED ORDER — PANTOPRAZOLE SODIUM 40 MG PO TBEC
40.0000 mg | DELAYED_RELEASE_TABLET | Freq: Every day | ORAL | Status: DC
  Administered 2021-05-26 – 2021-05-29 (×4): 40 mg via ORAL
  Filled 2021-05-24 (×4): qty 1

## 2021-05-24 MED ORDER — FENTANYL CITRATE (PF) 100 MCG/2ML IJ SOLN
100.0000 ug | Freq: Once | INTRAMUSCULAR | Status: AC
  Administered 2021-05-24: 100 ug via INTRAVENOUS
  Filled 2021-05-24: qty 2

## 2021-05-24 MED ORDER — CHLORHEXIDINE GLUCONATE 4 % EX LIQD: 60.0000 mL | Freq: Once | CUTANEOUS | Status: DC

## 2021-05-24 MED ORDER — ALBUTEROL SULFATE (2.5 MG/3ML) 0.083% IN NEBU: 3.0000 mL | INHALATION_SOLUTION | Freq: Four times a day (QID) | RESPIRATORY_TRACT | Status: DC | PRN

## 2021-05-24 MED ORDER — MORPHINE SULFATE (PF) 2 MG/ML IV SOLN
0.5000 mg | INTRAVENOUS | Status: DC | PRN
  Administered 2021-05-24 – 2021-05-25 (×2): 0.5 mg via INTRAVENOUS
  Filled 2021-05-24 (×2): qty 1

## 2021-05-24 MED ORDER — KETOROLAC TROMETHAMINE 0.5 % OP SOLN
1.0000 [drp] | Freq: Four times a day (QID) | OPHTHALMIC | Status: DC
  Administered 2021-05-25 – 2021-05-26 (×3): 1 [drp] via OPHTHALMIC
  Filled 2021-05-24: qty 5

## 2021-05-24 MED ORDER — TRANEXAMIC ACID-NACL 1000-0.7 MG/100ML-% IV SOLN
1000.0000 mg | INTRAVENOUS | Status: AC
  Administered 2021-05-25: 1000 mg via INTRAVENOUS
  Filled 2021-05-24: qty 100

## 2021-05-24 NOTE — H&P (Signed)
History and Physical   Hayden Green:[id number] DOB: 13-Nov-1927 DOA: 05/24/2021  Referring MD/NP/PA: Eulis Foster PCP: Rosita Fire, MD Outpatient Specialists: Alphia Moh (podiatry), Nicolette Bang (Urology), Marylynn Pearson (ophthalmology), Wilfred Curtis (Gerontology) Patient coming from: home  Chief Complaint: fall, hip fracture  HPI: Hayden Green is a 87 y.o. male with medical history significant of COPD, essential hypertension, type 2 diabetes mellitus, and history of renal cell carcinoma in remission presents with fall. Patient was climbing a 4-feet ladder to pick apples and fell then could not get back up. Pt reported that he yelled for help.  He left his Medic Alert bracelet inside of home and he was sitting on his phone and could not move.  Patient's neighbor heard and saw and came to his assistance and got him to the ED at [facility]. He is in pain.   ED Course: Hip Xray showed a closed comminuted fracture of the femur (not at femoral neck) - reviewed by hospitalist team. Lab works significant for mild hyponatremia Na 134, no leukocytosis, hyperglycemia BG 149. Resp panel including influenza A&B, SARS-CoV-2 negative. CXR shows no acute findings, only mild pulmonary venous congestion. EKG shows normal sinus rhythm with a heart rate of 73 bpm. Qtc 453.   Hospital medicine to admit for evaluation and treatment awaiting surgery tomorrow at [facility]. From a medical standpoint, patient is cleared for surgery. Patient is to remain NPO to prepare for surgery.  Review of Systems:   Review of Systems  Constitutional: Negative.   HENT: Negative.    Eyes:  Positive for blurred vision.  Respiratory: Negative.    Cardiovascular: Negative.   Gastrointestinal: Negative.   Genitourinary: Negative.   Musculoskeletal:  Positive for falls and joint pain.  Skin: Negative.   Neurological: Negative.   Endo/Heme/Allergies: Negative.   Psychiatric/Behavioral: Negative.    All other systems reviewed  and are negative.  Past Medical History:  Diagnosis Date   Arthritis    COPD (chronic obstructive pulmonary disease) ([place])    Diabetes mellitus without complication (HCC)    GERD (gastroesophageal reflux disease)    Heartburn    History of renal cell carcinoma 05/24/2021   Hypertension    Pneumonia    Renal cell carcinoma (HCC)     Past Surgical History:  Procedure Laterality Date   CHOLECYSTECTOMY     ESOPHAGOGASTRODUODENOSCOPY N/A 08/07/2017   Dr. Gala Romney: web in proximal esophagus, moderate acquired Schatzki's ring s/p dilation with 38 and 91 F, three-quarter bites with biopsy forceps to facilitate disruption. small hiatal hernia, duodenum normal   MALONEY DILATION N/A 08/07/2017   Procedure: MALONEY DILATION;  Surgeon: Daneil Dolin, MD;  Location: [facility];  Service: Endoscopy;  Laterality: N/A;   right nephrectomy       reports that he quit smoking about 15 years ago. He has quit using smokeless tobacco. He reports that he does not drink alcohol and does not use drugs.  Allergies  Allergen Reactions   Reglan [Metoclopramide] Swelling and Other (See Comments)    Caused oral swelling-states that this medication was taken once or twice in the past and the reaction was the same each time    Family History  Problem Relation Age of Onset   Colon cancer Neg Hx    Colon polyps Neg Hx    Prior to Admission medications   Medication Sig Start Date End Date Taking? Authorizing Provider  albuterol (PROVENTIL HFA;VENTOLIN HFA) 108 (90 BASE) MCG/ACT inhaler Inhale 2 puffs into  the lungs every 6 (six) hours as needed for wheezing or shortness of breath.    Yes [provider]  finasteride (PROSCAR) 5 MG tablet Take 1 tablet (5 mg total) by mouth daily. 01/11/21  Yes McKenzie, Candee Furbish, MD  ketorolac (ACULAR) 0.5 % ophthalmic solution Place 1 drop into the right eye 4 (four) times daily. 05/19/21  Yes [provider]  lisinopril-hydrochlorothiazide (ZESTORETIC)  20-12.5 MG tablet Take 1 tablet by mouth daily. 01/19/20  Yes [provider]  LUMIGAN 0.01 % SOLN Place 1 drop into both eyes at bedtime. 02/24/21  Yes [provider]  meclizine (ANTIVERT) 25 MG tablet Take 25 mg by mouth 4 (four) times daily as needed for dizziness.   Yes [provider]  metFORMIN (GLUCOPHAGE) 500 MG tablet Take 1 tablet (500 mg total) by mouth 2 (two) times daily with a meal. 09/11/11  Yes Fanta, Tesfaye, MD  naproxen sodium (ANAPROX) 220 MG tablet Take 220 mg by mouth daily as needed (pain).   Yes [provider]  omeprazole (PRILOSEC) 20 MG capsule Take 20 mg by mouth daily as needed (for acid reflux).   Yes [provider]  SIMBRINZA 1-0.2 % SUSP Place 1 drop into both eyes 2 (two) times daily. 06/08/17  Yes [provider]  VYZULTA 0.024 % SOLN Place 1 drop into both eyes at bedtime. 02/11/18  Yes [provider]  Accu-Chek FastClix Lancets MISC  03/03/20   [provider]  ACCU-CHEK GUIDE test strip  03/03/20   [provider]    Physical Exam: Vitals:   05/24/21 1345 05/24/21 1400 05/24/21 1415 05/24/21 1430  BP:  140/73    Pulse:  81    Resp: (!) 21 17 (!) 21 (!) 25  Temp:      TempSrc:      SpO2:  96%    Weight:      Height:       Vitals:   05/24/21 1345 05/24/21 1400 05/24/21 1415 05/24/21 1430  BP:  140/73    Pulse:  81    Resp: (!) 21 17 (!) 21 (!) 25  Temp:      TempSrc:      SpO2:  96%    Weight:      Height:       Constitutional: elderly man lying in bed, awake and alert Eyes: PERRL, lids and conjunctivae anicteric ENMT: Mucous membranes are moist. Missing teeth. Normal dentition Neck: normal, supple Respiratory: clear to auscultation bilaterally, no wheezing, no crackles. Normal respiratory effort. No accessory muscle use.  Cardiovascular: Regular rate and rhythm, no murmurs / rubs / gallops. No extremity edema. 2+ pedal pulses. No carotid bruits.  Abdomen: no  tenderness, no masses palpated. No hepatosplenomegaly. Bowel sounds positive.  Musculoskeletal: Left hip lateral rotated with significant pain with movement. 5/5 strength in left leg and upper extremities Skin: Warm, dry Neurologic: CN 2-12 grossly intact. No neurological focal deficits Psychiatric: Normal judgment and insight. Alert and oriented x 3. Mood in full range  Labs on Admission: I have personally reviewed following labs and imaging studies  CBC: Recent Labs  Lab 05/24/21 1245  WBC 8.8  NEUTROABS 7.0  HGB 11.3*  HCT 36.2*  MCV 91.0  PLT 786*   Basic Metabolic Panel: Recent Labs  Lab 05/24/21 1245  NA 134*  K 3.6  CL 101  CO2 26  GLUCOSE 149*  BUN 17  CREATININE 1.08  CALCIUM 9.6   GFR: Estimated Creatinine  Clearance: 44.8 mL/min (by C-G formula based on SCr of 1.08 mg/dL). Liver Function Tests: Recent Labs  Lab 05/24/21 1245  AST 22  ALT 11  ALKPHOS 67  BILITOT 0.8  PROT 6.5  ALBUMIN 4.0   No results for input(s): LIPASE, AMYLASE in the last 168 hours. No results for input(s): AMMONIA in the last 168 hours. Coagulation Profile: No results for input(s): INR, PROTIME in the last 168 hours. Cardiac Enzymes: No results for input(s): CKTOTAL, CKMB, CKMBINDEX, TROPONINI in the last 168 hours. BNP (last 3 results) No results for input(s): PROBNP in the last 8760 hours. HbA1C: No results for input(s): HGBA1C in the last 72 hours. CBG: No results for input(s): GLUCAP in the last 168 hours. Lipid Profile: No results for input(s): CHOL, HDL, LDLCALC, TRIG, CHOLHDL, LDLDIRECT in the last 72 hours. Thyroid Function Tests: No results for input(s): TSH, T4TOTAL, FREET4, T3FREE, THYROIDAB in the last 72 hours. Anemia Panel: No results for input(s): VITAMINB12, FOLATE, FERRITIN, TIBC, IRON, RETICCTPCT in the last 72 hours. Urine analysis:    Component Value Date/Time   COLORURINE RED (A) 07/02/2020 0957   APPEARANCEUR Negative 01/11/2021 1016   LABSPEC  1.015 07/02/2020 0957   PHURINE 7.0 07/02/2020 0957   GLUCOSEU Negative 01/11/2021 1016   HGBUR (A) 07/02/2020 0957    TEST NOT REPORTED DUE TO COLOR INTERFERENCE OF URINE PIGMENT   BILIRUBINUR Negative 01/11/2021 1016   KETONESUR (A) 07/02/2020 0957    TEST NOT REPORTED DUE TO COLOR INTERFERENCE OF URINE PIGMENT   PROTEINUR Negative 01/11/2021 1016   PROTEINUR (A) 07/02/2020 0957    TEST NOT REPORTED DUE TO COLOR INTERFERENCE OF URINE PIGMENT   UROBILINOGEN 0.2 09/08/2011 0730   NITRITE Negative 01/11/2021 1016   NITRITE (A) 07/02/2020 0957    TEST NOT REPORTED DUE TO COLOR INTERFERENCE OF URINE PIGMENT   LEUKOCYTESUR Negative 01/11/2021 1016   LEUKOCYTESUR (A) 07/02/2020 0957    TEST NOT REPORTED DUE TO COLOR INTERFERENCE OF URINE PIGMENT   Recent Results (from the past 240 hour(s))  Resp Panel by RT-PCR (Flu A&B, Covid) Nasopharyngeal Swab     Status: None   Collection Time: 05/24/21 12:34 PM   Specimen: Nasopharyngeal Swab; Nasopharyngeal(NP) swabs in vial transport medium  Result Value Ref Range Status   SARS Coronavirus 2 by RT PCR NEGATIVE NEGATIVE Final    Comment: (NOTE) SARS-CoV-2 target nucleic acids are NOT DETECTED.  The SARS-CoV-2 RNA is generally detectable in upper respiratory specimens during the acute phase of infection. The lowest concentration of SARS-CoV-2 viral copies this assay can detect is 138 copies/mL. A negative result does not preclude SARS-Cov-2 infection and should not be used as the sole basis for treatment or other patient management decisions. A negative result may occur with  improper specimen collection/handling, submission of specimen other than nasopharyngeal swab, presence of viral mutation(s) within the areas targeted by this assay, and inadequate number of viral copies(<138 copies/mL). A negative result must be combined with clinical observations, patient history, and epidemiological information. The expected result is Negative.  Fact  Sheet for Patients:  EntrepreneurPulse.com.au  Fact Sheet for Healthcare Providers:  IncredibleEmployment.be  This test is no t yet approved or cleared by the [place] FDA and  has been authorized for detection and/or diagnosis of SARS-CoV-2 by FDA under an Emergency Use Authorization (EUA). This EUA will remain  in effect (meaning this test can be used) for the duration of the COVID-19 declaration under Section 564(b)(1) of the Act, 21  U.S.C.section 360bbb-3(b)(1), unless the authorization is terminated  or revoked sooner.       Influenza A by PCR NEGATIVE NEGATIVE Final   Influenza B by PCR NEGATIVE NEGATIVE Final    Comment: (NOTE) The Xpert Xpress SARS-CoV-2/FLU/RSV plus assay is intended as an aid in the diagnosis of influenza from Nasopharyngeal swab specimens and should not be used as a sole basis for treatment. Nasal washings and aspirates are unacceptable for Xpert Xpress SARS-CoV-2/FLU/RSV testing.  Fact Sheet for Patients: EntrepreneurPulse.com.au  Fact Sheet for Healthcare Providers: IncredibleEmployment.be  This test is not yet approved or cleared by the [place] FDA and has been authorized for detection and/or diagnosis of SARS-CoV-2 by FDA under an Emergency Use Authorization (EUA). This EUA will remain in effect (meaning this test can be used) for the duration of the COVID-19 declaration under Section 564(b)(1) of the Act, 21 U.S.C. section 360bbb-3(b)(1), unless the authorization is terminated or revoked.  Performed at [facility], [address] [place],  [address]     Radiological Exams on Admission: DG Chest 1 View  Result Date: 05/24/2021 CLINICAL DATA:  Pt had a fall earlier today from a ladder. Hx of COPD, HTN, pneumonia,and former smoker. Covid test pending. EXAM: CHEST  1 VIEW COMPARISON:  09/24/2016 FINDINGS: Relatively low lung volumes with resultant crowding  of bronchovascular structures, and suspicion of mild central pulmonary vascular congestion. 7 mm nodular density projects over the mid right lung, present since 2013 presumably benign. Heart size upper limits normal for technique. Aortic Atherosclerosis (ICD10-170.0). No effusion.  No pneumothorax. Visualized bones unremarkable. Cholecystectomy clips. Stable sclerosis in the right humeral head. IMPRESSION: Low volumes, possible mild pulmonary vascular congestion Electronically Signed   By: Lucrezia Europe M.D.   On: 05/24/2021 13:43   DG Hip Unilat With Pelvis 2-3 Views Left  Result Date: 05/24/2021 CLINICAL DATA:  Left hip pain post fall off ladder EXAM: DG HIP (WITH OR WITHOUT PELVIS) 2-3V LEFT COMPARISON:  CT 07/02/2020 FINDINGS: Comminuted left intertrochanteric fracture with mild varus deformity. No dislocation. Pelvic ring appears intact. IMPRESSION: Comminuted left IT femur fracture Electronically Signed   By: Lucrezia Europe M.D.   On: 05/24/2021 13:55    EKG: Independently reviewed by me; sinus rhythm at rate of 73 bpm.    Assessment/Plan Active Problems:   Closed left hip fracture (HCC)   Essential hypertension   Diabetes (HCC)   COPD (chronic obstructive pulmonary disease) (HCC)  Hayden Green is a 87 year-old man with history of DM, HTN, COPD, h/o renal cell carcinoma presents with fall and L hip fracture found on XR. Hospitalist to admit for medical evaluation and treatment.   Comminuted fracture of L femur - confirmed on XR hip, with 2+ pedal pulse  - consulted to orthopedics, surgery scheduled for tomorrow - NPO - Influenza A, B, SARS-COV-2 negative  Diabetes mellitus type 2 with diabetic retinopathy - BG 149 - NPO after midnight  - SSI - hold home metformin  - Check A1c, last in system was 6.5 in 2012  Glaucoma and diabetic retinopathy  - resume home eye drops - pt recently completed laser eye treatments last week  Hypertension  - BP 140/70 - resume home medication -  lisinopril/HCTZ (Zestoretic) and continue with resume of diet  COPD  - stable - continue home proventil   Falls  - precautions   History of RCC - reportedly in remission,  following with outpatient urologist Dr. Alyson Ingles - hold home finasteride 5mg  until cleared for  PO diet  GERD - Continue protonix 40mg    POAG  - Follow with outpatient ophthalmologist - Continue eyedrops   DVT prophylaxis: Lovenox  Code Status: Full Family Communication: wife Regino Schultze at bedside Disposition Plan: admit Consults called: Orthopedics (Dr. Stann Mainland) Admission status: obs  Severity of Illness: The appropriate patient status for this patient is OBSERVATION. Observation status is judged to be reasonable and necessary in order to provide the required intensity of service to ensure the patient's safety. The patient's presenting symptoms, physical exam findings, and initial radiographic and laboratory data in the context of their medical condition is felt to place them at decreased risk for further clinical deterioration. Furthermore, it is anticipated that the patient will be medically stable for discharge from the hospital within 2 midnights of admission. The following factors support the patient status of observation.   " The patient's presenting symptoms include falls, hip pain. " The physical exam findings include hip pain, lateral flexion of L hip. " The initial radiographic and laboratory data are comminuted fracture of proximal femur on left.   [place] [facility] Pager [phone]  If 7PM-7AM, please contact night-coverage [email or web address] Password [facility]  05/24/2021, 3:21 PM   ATTENDING PHYSICIAN  Patient seen and examined with Fredrik Rigger, Medical student. In addition to supervising the encounter, I played a key role in the decision making process as well as reviewed key findings.  Gerlene Fee, MD FAAFP How to contact the [facility] Attending or Consulting provider [place] or  covering provider during after hours [place], for this patient?  Check the care team in [facility] and look for a) attending/consulting TR[facility] provider listed and b) the [facility] team listed Log into [email or web address] and use [place]'s universal password to access. If you do not have the password, please contact the hospital operator. Locate the [facility] provider you are looking for under [facility] and page to a number that you can be directly reached. If you still have difficulty reaching the provider, please page the [facility] (Director on Call) for the Hospitalists listed on amion for assistance.

## 2021-05-24 NOTE — Progress Notes (Signed)
Case d/w Dr. Eulis Foster.  Pt will need left hip surgery with IM nail.  Please admit to mdicine at [facility].  Surgery tomorrow with either me or Dr. Lyla Glassing.  Thank you

## 2021-05-24 NOTE — ED Triage Notes (Signed)
Pt feel off a ladder 4 feet getting an apple, left hip pain, skin warm dry, denies hitting his head, no blood thinners.  Good pulses, 100 fent given by EMS, no other injuries

## 2021-05-24 NOTE — ED Notes (Signed)
Called [facility] for transport to [facility].

## 2021-05-24 NOTE — ED Provider Notes (Addendum)
[facility] Provider Note   CSN: [id number] Arrival date & time: 05/24/21  1031     History Chief Complaint  Patient presents with   Fall    Left hip pain    Hayden Green is a 87 y.o. male.  HPI He was on a ladder about 4 feet up, picking apples when he fell to the ground.  He was able to summon help by hollering.  He presents by EMS for evaluation of an isolated injury to his left hip.  He was treated with fentanyl during transport with improvement of his pain.  No other injuries were noted.  Patient denies preceding symptoms and denies other complaints at this time.  No recent illnesses.  He is taking his usual medications.  He has had COVID vaccines.  There are no other known active modifying factors.    Past Medical History:  Diagnosis Date   Arthritis    COPD (chronic obstructive pulmonary disease) ([place])    Diabetes mellitus without complication (HCC)    GERD (gastroesophageal reflux disease)    Heartburn    Hypertension    Pneumonia    Renal cell carcinoma [facility])     Patient Active Problem List   Diagnosis Date Noted   Closed left hip fracture ([place]) 05/24/2021   Dysphagia 07/12/2017    Past Surgical History:  Procedure Laterality Date   CHOLECYSTECTOMY     ESOPHAGOGASTRODUODENOSCOPY N/A 08/07/2017   Dr. 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 small hiatal hernia, duodenum normal   MALONEY DILATION N/A 08/07/2017   Procedure: MALONEY DILATION;  Surgeon: Daneil Dolin, MD;  Location: [facility];  Service: Endoscopy;  Laterality: N/A;   right nephrectomy         Family History  Problem Relation Age of Onset   Colon cancer Neg Hx    Colon polyps Neg Hx     Social History   Tobacco Use   Smoking status: Former    Years: 25.00    Types: Cigarettes    Quit date: 07/06/2005    Years since quitting: 15.8    Smokeless tobacco: Former  Scientific laboratory technician Use: Never used  Substance Use Topics   Alcohol use: No   Drug use: No    Home Medications Prior to Admission medications   Medication Sig Start Date End Date Taking? Authorizing Provider  albuterol (PROVENTIL HFA;VENTOLIN HFA) 108 (90 BASE) MCG/ACT inhaler Inhale 2 puffs into the lungs every 6 (six) hours as needed for wheezing or shortness of breath.    Yes [provider]  finasteride (PROSCAR) 5 MG tablet Take 1 tablet (5 mg total) by mouth daily. 01/11/21  Yes McKenzie, Candee Furbish, MD  ketorolac (ACULAR) 0.5 % ophthalmic solution Place 1 drop into the right eye 4 (four) times daily. 05/19/21  Yes [provider]  lisinopril-hydrochlorothiazide (ZESTORETIC) 20-12.5 MG tablet Take 1 tablet by mouth daily. 01/19/20  Yes [provider]  LUMIGAN 0.01 % SOLN Place 1 drop into both eyes at bedtime. 02/24/21  Yes [provider]  meclizine (ANTIVERT) 25 MG tablet Take 25 mg by mouth 4 (four) times daily as needed for dizziness.   Yes [provider]  metFORMIN (GLUCOPHAGE) 500 MG tablet Take 1 tablet (500 mg total) by mouth 2 (two) times daily with a meal. 09/11/11  Yes Rosita Fire, MD  naproxen  sodium (ANAPROX) 220 MG tablet Take 220 mg by mouth daily as needed (pain).   Yes [provider]  omeprazole (PRILOSEC) 20 MG capsule Take 20 mg by mouth daily as needed (for acid reflux).   Yes [provider]  SIMBRINZA 1-0.2 % SUSP Place 1 drop into both eyes 2 (two) times daily. 06/08/17  Yes [provider]  VYZULTA 0.024 % SOLN Place 1 drop into both eyes at bedtime. 02/11/18  Yes [provider]  Accu-Chek FastClix Lancets MISC  03/03/20   [provider]  ACCU-CHEK GUIDE test strip  03/03/20   [provider]    Allergies    Reglan [metoclopramide]  Review of Systems   Review of Systems  All other systems reviewed and are negative.  Physical  Exam Updated Vital Signs BP 140/73   Pulse 81   Temp 98.1 F (36.7 C) (Oral)   Resp (!) 25   Ht 5\' 6"  (1.676 m)   Wt 82 kg   SpO2 96%   BMI 29.18 kg/m   Physical Exam Vitals and nursing note reviewed.  Constitutional:      General: He is not in acute distress.    Appearance: He is well-developed. He is not ill-appearing, toxic-appearing or diaphoretic.  HENT:     Head: Normocephalic and atraumatic.     Right Ear: External ear normal.     Left Ear: External ear normal.  Eyes:     Conjunctiva/sclera: Conjunctivae normal.     Pupils: Pupils are equal, round, and reactive to light.  Neck:     Trachea: Phonation normal.  Cardiovascular:     Rate and Rhythm: Normal rate and regular rhythm.     Heart sounds: Normal heart sounds.  Pulmonary:     Effort: Pulmonary effort is normal.     Breath sounds: Normal breath sounds.  Chest:     Chest wall: No tenderness.  Abdominal:     General: There is no distension.     Palpations: Abdomen is soft.     Tenderness: There is no abdominal tenderness.  Musculoskeletal:     Cervical back: Normal range of motion and neck supple.     Comments: Left leg shortening.  Left hip externally rotated and tender with passive motion.  Skin:    General: Skin is warm and dry.  Neurological:     Mental Status: He is alert and oriented to person, place, and time.     Cranial Nerves: No cranial nerve deficit.     Sensory: No sensory deficit.     Motor: No abnormal muscle tone.     Coordination: Coordination normal.  Psychiatric:        Mood and Affect: Mood normal.        Behavior: Behavior normal.        Thought Content: Thought content normal.        Judgment: Judgment normal.    ED Results / Procedures / Treatments   Labs (all labs ordered are listed, but only abnormal results are displayed) Labs Reviewed  COMPREHENSIVE METABOLIC PANEL - Abnormal; Notable for the following components:      Result Value   Sodium 134 (*)    Glucose, Bld 149  (*)    All other components within normal limits  CBC WITH DIFFERENTIAL/PLATELET - Abnormal; Notable for the following components:   RBC 3.98 (*)    Hemoglobin 11.3 (*)    HCT 36.2 (*)    Platelets 141 (*)  All other components within normal limits  RESP PANEL BY RT-PCR (FLU A&B, COVID) [id number]  TYPE AND SCREEN  ABO/RH    EKG EKG Interpretation  Date/Time:  Wednesday May 24 2021 10:47:27 EDT Ventricular Rate:  67 PR Interval:  210 QRS Duration: 94 QT Interval:  391 QTC Calculation: 413 R Axis:   -34 Text Interpretation: Sinus rhythm Abnormal R-wave progression, early transition Inferior infarct, old since last tracing no significant change Confirmed by Daleen Bo [phone]) on 05/24/2021 3:19:35 PM  Radiology DG Chest 1 View  Result Date: 05/24/2021 CLINICAL DATA:  Pt had a fall earlier today from a ladder. Hx of COPD, HTN, pneumonia,and former smoker. Covid test pending. EXAM: CHEST  1 VIEW COMPARISON:  09/24/2016 FINDINGS: Relatively low lung volumes with resultant crowding of bronchovascular structures, and suspicion of mild central pulmonary vascular congestion. 7 mm nodular density projects over the mid right lung, present since 2013 presumably benign. Heart size upper limits normal for technique. Aortic Atherosclerosis (ICD10-170.0). No effusion.  No pneumothorax. Visualized bones unremarkable. Cholecystectomy clips. Stable sclerosis in the right humeral head. IMPRESSION: Low volumes, possible mild pulmonary vascular congestion Electronically Signed   By: Lucrezia Europe M.D.   On: 05/24/2021 13:43   DG Hip Unilat With Pelvis 2-3 Views Left  Result Date: 05/24/2021 CLINICAL DATA:  Left hip pain post fall off ladder EXAM: DG HIP (WITH OR WITHOUT PELVIS) 2-3V LEFT COMPARISON:  CT 07/02/2020 FINDINGS: Comminuted left intertrochanteric fracture with mild varus deformity. No dislocation. Pelvic ring appears intact. IMPRESSION: Comminuted left IT femur fracture Electronically Signed    By: Lucrezia Europe M.D.   On: 05/24/2021 13:55    Procedures Procedures   Medications Ordered in ED Medications  0.9 %  sodium chloride infusion ( Intravenous New Bag/Given 05/24/21 1321)  fentaNYL (SUBLIMAZE) injection 100 mcg (100 mcg Intravenous Given 05/24/21 1259)    ED Course  I have reviewed the triage vital signs and the nursing notes.  Pertinent labs & imaging results that were available during my care of the patient were reviewed by me and considered in my medical decision making (see chart for details).  Clinical Course as of 05/24/21 1519  Wed May 24, 2021  1437 Case discussed with orthopedics, Dr. Stann Mainland to arrange for surgery.  He wants patient transferred to [facility] and will prepare for surgery tomorrow. [EW]    Clinical Course User Index [EW] Daleen Bo, MD   MDM Rules/Calculators/A&P                            Patient Vitals for the past 24 hrs:  BP Temp Temp src Pulse Resp SpO2 Height Weight  05/24/21 1430 -- -- -- -- (!) 25 -- -- --  05/24/21 1415 -- -- -- -- (!) 21 -- -- --  05/24/21 1400 140/73 -- -- 81 17 96 % -- --  05/24/21 1345 -- -- -- -- (!) 21 -- -- --  05/24/21 1330 (!) 143/63 -- -- 76 20 97 % -- --  05/24/21 1315 -- -- -- 82 -- 100 % -- --  05/24/21 1300 (!) 148/76 -- -- 78 17 96 % -- --  05/24/21 1245 -- -- -- 67 17 99 % -- --  05/24/21 1230 135/75 -- -- 68 (!) 21 100 % -- --  05/24/21 1215 -- -- -- 73 16 (!) 83 % -- --  05/24/21 1200 129/90 -- -- 77 20 100 % -- --  05/24/21 1145 -- -- -- 69 20 97 % -- --  05/24/21 1130 127/65 -- -- 64 19 95 % -- --  05/24/21 1115 -- -- -- 71 19 96 % -- --  05/24/21 1100 127/62 -- -- 67 18 93 % -- --  05/24/21 1048 120/70 98.1 F (36.7 C) Oral 68 18 93 % -- --  05/24/21 1045 120/70 -- -- 70 (!) 22 96 % -- --  05/24/21 1039 -- -- -- -- -- -- 5\' 6"  (1.676 m) 82 kg    2:38 PM Reevaluation with update and discussion. After initial assessment and treatment, an updated evaluation reveals patient is fairly  comfortable now.  Findings discussed with patient, and wife at bedside, all questions answered. Daleen Bo   Medical Decision Making:  This patient is presenting for evaluation of left hip injury sustained in a fall, which does require a range of treatment options, and is a complaint that involves a moderate risk of morbidity and mortality. The differential diagnoses include fracture, contusion. I decided to review old records, and in summary advanced elderly patient who is active, fell today while picking apples.  He has history of high blood pressure and diabetes.  He does not take anticoagulant medication.  I did not require additional historical information from anyone.  Clinical Laboratory Tests Ordered, included CBC, Metabolic panel, and viral panel . Review indicates normal except glucose high, hemoglobin low. Radiologic Tests Ordered, included left hip x-ray, chest x-ray.  I independently Visualized: Radiograph images, which show left hip intertrochanteric fracture    Critical Interventions-clinical evaluation, laboratory testing, radiography, medication treatment, observation and reassess  After These Interventions, the Patient was reevaluated and was found with hip fracture requiring operative intervention.  Patient medically stable.  He will be transferred to [facility] in [place], [place] for treatment.  CRITICAL CARE-no Performed by: Daleen Bo  Nursing Notes Reviewed/ Care Coordinated Applicable Imaging Reviewed Interpretation of Laboratory Data incorporated into ED treatment  2:29 PM-Consult complete with hospitalist. Patient case explained and discussed.  He agrees to admit patient for further evaluation and treatment. Call ended at 2:37 PM    Final Clinical Impression(s) / ED Diagnoses Final diagnoses:  Closed fracture of left hip, initial encounter [facility])    Rx / [place] Orders ED Discharge Orders     None        Daleen Bo,  MD 05/24/21 1440    Daleen Bo, MD 05/24/21 1520

## 2021-05-25 ENCOUNTER — Inpatient Hospital Stay (HOSPITAL_COMMUNITY): Payer: Medicare Other

## 2021-05-25 ENCOUNTER — Inpatient Hospital Stay (HOSPITAL_COMMUNITY): Payer: Medicare Other | Admitting: Anesthesiology

## 2021-05-25 ENCOUNTER — Encounter (HOSPITAL_COMMUNITY): Payer: Self-pay | Admitting: Family Medicine

## 2021-05-25 ENCOUNTER — Encounter (HOSPITAL_COMMUNITY)
Admission: EM | Disposition: A | Discharge status: Skilled Nursing Facility | Payer: Self-pay | Source: Home / Self Care | Attending: Internal Medicine

## 2021-05-25 DIAGNOSIS — S72002A Fracture of unspecified part of neck of left femur, initial encounter for closed fracture: Secondary | ICD-10-CM | POA: Diagnosis not present

## 2021-05-25 DIAGNOSIS — D649 Anemia, unspecified: Secondary | ICD-10-CM | POA: Diagnosis not present

## 2021-05-25 DIAGNOSIS — E119 Type 2 diabetes mellitus without complications: Secondary | ICD-10-CM

## 2021-05-25 DIAGNOSIS — I10 Essential (primary) hypertension: Secondary | ICD-10-CM | POA: Diagnosis not present

## 2021-05-25 HISTORY — PX: INTRAMEDULLARY (IM) NAIL INTERTROCHANTERIC: SHX5875

## 2021-05-25 LAB — CBC
HCT: 32 % — ABNORMAL LOW (ref 39.0–52.0)
Hemoglobin: 9.8 g/dL — ABNORMAL LOW (ref 13.0–17.0)
MCH: 27.9 pg (ref 26.0–34.0)
MCHC: 30.6 g/dL (ref 30.0–36.0)
MCV: 91.2 fL (ref 80.0–100.0)
Platelets: 132 10*3/uL — ABNORMAL LOW (ref 150–400)
RBC: 3.51 MIL/uL — ABNORMAL LOW (ref 4.22–5.81)
RDW: 12.2 % (ref 11.5–15.5)
WBC: 9 10*3/uL (ref 4.0–10.5)
nRBC: 0 % (ref 0.0–0.2)

## 2021-05-25 LAB — COMPREHENSIVE METABOLIC PANEL
ALT: 10 U/L (ref 0–44)
AST: 21 U/L (ref 15–41)
Albumin: 3.5 g/dL (ref 3.5–5.0)
Alkaline Phosphatase: 54 U/L (ref 38–126)
Anion gap: 6 (ref 5–15)
BUN: 15 mg/dL (ref 8–23)
CO2: 23 mmol/L (ref 22–32)
Calcium: 9.1 mg/dL (ref 8.9–10.3)
Chloride: 107 mmol/L (ref 98–111)
Creatinine, Ser: 1.05 mg/dL (ref 0.61–1.24)
GFR, Estimated: >60 mL/min (ref >60)
Glucose, Bld: 142 mg/dL — ABNORMAL HIGH (ref 70–99)
Potassium: 3.7 mmol/L (ref 3.5–5.1)
Sodium: 136 mmol/L (ref 135–145)
Total Bilirubin: 0.9 mg/dL (ref 0.3–1.2)
Total Protein: 5.8 g/dL — ABNORMAL LOW (ref 6.5–8.1)

## 2021-05-25 LAB — GLUCOSE, CAPILLARY
Glucose-Capillary: 142 mg/dL — ABNORMAL HIGH (ref 70–99)
Glucose-Capillary: 101 mg/dL — ABNORMAL HIGH (ref 70–99)
Glucose-Capillary: 178 mg/dL — ABNORMAL HIGH (ref 70–99)
Glucose-Capillary: 207 mg/dL — ABNORMAL HIGH (ref 70–99)

## 2021-05-25 LAB — MAGNESIUM: Magnesium: 1.7 mg/dL (ref 1.7–2.4)

## 2021-05-25 SURGERY — FIXATION, FRACTURE, INTERTROCHANTERIC, WITH INTRAMEDULLARY ROD
Anesthesia: Spinal | Laterality: Left

## 2021-05-25 MED ORDER — PROPOFOL 500 MG/50ML IV EMUL
INTRAVENOUS | Status: DC | PRN
Start: 2021-05-25 — End: 2021-05-25
  Administered 2021-05-25: 75 ug/kg/min via INTRAVENOUS

## 2021-05-25 MED ORDER — ONDANSETRON HCL 4 MG PO TABS: 4.0000 mg | ORAL_TABLET | Freq: Four times a day (QID) | ORAL | Status: DC | PRN

## 2021-05-25 MED ORDER — HYDROCODONE-ACETAMINOPHEN 7.5-325 MG PO TABS: 1.0000 | ORAL_TABLET | ORAL | Status: DC | PRN

## 2021-05-25 MED ORDER — PHENYLEPHRINE HCL-NACL 10-0.9 MG/250ML-% IV SOLN
INTRAVENOUS | Status: DC | PRN
  Administered 2021-05-25: 65 ug/min via INTRAVENOUS

## 2021-05-25 MED ORDER — DOCUSATE SODIUM 100 MG PO CAPS
100.0000 mg | ORAL_CAPSULE | Freq: Two times a day (BID) | ORAL | Status: DC
  Administered 2021-05-25 – 2021-05-28 (×5): 100 mg via ORAL
  Filled 2021-05-25 (×5): qty 1

## 2021-05-25 MED ORDER — MENTHOL 3 MG MT LOZG: 1.0000 | LOZENGE | OROMUCOSAL | Status: DC | PRN

## 2021-05-25 MED ORDER — DEXAMETHASONE SODIUM PHOSPHATE 10 MG/ML IJ SOLN
INTRAMUSCULAR | Status: DC | PRN
  Administered 2021-05-25: 4 mg via INTRAVENOUS

## 2021-05-25 MED ORDER — SODIUM CHLORIDE 0.9 % IR SOLN
Status: DC | PRN
  Administered 2021-05-25: 1000 mL

## 2021-05-25 MED ORDER — MORPHINE SULFATE (PF) 2 MG/ML IV SOLN: 0.5000 mg | INTRAVENOUS | Status: DC | PRN

## 2021-05-25 MED ORDER — LACTATED RINGERS IV SOLN: INTRAVENOUS | Status: DC

## 2021-05-25 MED ORDER — FENTANYL CITRATE (PF) 100 MCG/2ML IJ SOLN
INTRAMUSCULAR | Status: AC
  Filled 2021-05-25: qty 2

## 2021-05-25 MED ORDER — ACETAMINOPHEN 325 MG PO TABS: 325.0000 mg | ORAL_TABLET | Freq: Four times a day (QID) | ORAL | Status: DC | PRN

## 2021-05-25 MED ORDER — ADULT MULTIVITAMIN W/MINERALS CH
1.0000 | ORAL_TABLET | Freq: Every day | ORAL | Status: DC
  Administered 2021-05-25 – 2021-05-29 (×5): 1 via ORAL
  Filled 2021-05-25 (×4): qty 1

## 2021-05-25 MED ORDER — ONDANSETRON HCL 4 MG/2ML IJ SOLN
INTRAMUSCULAR | Status: DC | PRN
  Administered 2021-05-25: 4 mg via INTRAVENOUS

## 2021-05-25 MED ORDER — DIPHENHYDRAMINE HCL 50 MG/ML IJ SOLN: 6.2500 mg | Freq: Once | INTRAMUSCULAR | Status: DC

## 2021-05-25 MED ORDER — TRANEXAMIC ACID-NACL 1000-0.7 MG/100ML-% IV SOLN
1000.0000 mg | Freq: Once | INTRAVENOUS | Status: AC
  Administered 2021-05-25: 1000 mg via INTRAVENOUS
  Filled 2021-05-25: qty 100

## 2021-05-25 MED ORDER — BUPIVACAINE IN DEXTROSE 0.75-8.25 % IT SOLN
INTRATHECAL | Status: DC | PRN
  Administered 2021-05-25: 1.6 mL via INTRATHECAL

## 2021-05-25 MED ORDER — PROPOFOL 10 MG/ML IV BOLUS
INTRAVENOUS | Status: AC
  Filled 2021-05-25: qty 20

## 2021-05-25 MED ORDER — EPHEDRINE 5 MG/ML INJ
INTRAVENOUS | Status: AC
  Filled 2021-05-25: qty 5

## 2021-05-25 MED ORDER — AMISULPRIDE (ANTIEMETIC) 5 MG/2ML IV SOLN: 10.0000 mg | Freq: Once | INTRAVENOUS | Status: DC | PRN

## 2021-05-25 MED ORDER — CEFAZOLIN SODIUM 2 G IJ SOLR
2.0000 g | Freq: Four times a day (QID) | INTRAMUSCULAR | Status: AC
  Administered 2021-05-25 (×2): 2 g via INTRAVENOUS
  Filled 2021-05-25 (×2): qty 2

## 2021-05-25 MED ORDER — GLUCERNA SHAKE PO LIQD
237.0000 mL | Freq: Three times a day (TID) | ORAL | Status: DC
  Administered 2021-05-25 – 2021-05-29 (×12): 237 mL via ORAL
  Filled 2021-05-25 (×14): qty 237

## 2021-05-25 MED ORDER — FENTANYL CITRATE (PF) 100 MCG/2ML IJ SOLN: 25.0000 ug | INTRAMUSCULAR | Status: DC | PRN

## 2021-05-25 MED ORDER — PROPOFOL 1000 MG/100ML IV EMUL
INTRAVENOUS | Status: AC
  Filled 2021-05-25: qty 200

## 2021-05-25 MED ORDER — PHENOL 1.4 % MT LIQD: 1.0000 | OROMUCOSAL | Status: DC | PRN

## 2021-05-25 MED ORDER — PROPOFOL 10 MG/ML IV BOLUS
INTRAVENOUS | Status: DC | PRN
  Administered 2021-05-25: 25 mg via INTRAVENOUS

## 2021-05-25 MED ORDER — HYDROCODONE-ACETAMINOPHEN 5-325 MG PO TABS
1.0000 | ORAL_TABLET | ORAL | Status: DC | PRN
  Administered 2021-05-25 (×2): 2 via ORAL
  Administered 2021-05-26 (×2): 1 via ORAL
  Administered 2021-05-27: 2 via ORAL
  Administered 2021-05-27: 1 via ORAL
  Administered 2021-05-27: 2 via ORAL
  Administered 2021-05-29: 1 via ORAL
  Filled 2021-05-25: qty 2
  Filled 2021-05-25: qty 1
  Filled 2021-05-25 (×5): qty 2

## 2021-05-25 MED ORDER — EPHEDRINE SULFATE-NACL 50-0.9 MG/10ML-% IV SOSY
PREFILLED_SYRINGE | INTRAVENOUS | Status: DC | PRN
Start: 2021-05-25 — End: 2021-05-25
  Administered 2021-05-25 (×4): 5 mg via INTRAVENOUS

## 2021-05-25 MED ORDER — FENTANYL CITRATE (PF) 100 MCG/2ML IJ SOLN
INTRAMUSCULAR | Status: DC | PRN
  Administered 2021-05-25: 75 ug via INTRAVENOUS

## 2021-05-25 MED ORDER — ASPIRIN EC 325 MG PO TBEC
325.0000 mg | DELAYED_RELEASE_TABLET | Freq: Every day | ORAL | Status: DC
  Administered 2021-05-26 – 2021-05-29 (×4): 325 mg via ORAL
  Filled 2021-05-25 (×4): qty 1

## 2021-05-25 MED ORDER — DEXMEDETOMIDINE (PRECEDEX) IN NS 20 MCG/5ML (4 MCG/ML) IV SYRINGE
PREFILLED_SYRINGE | INTRAVENOUS | Status: AC
  Filled 2021-05-25: qty 5

## 2021-05-25 MED ORDER — ONDANSETRON HCL 4 MG/2ML IJ SOLN: 4.0000 mg | Freq: Four times a day (QID) | INTRAMUSCULAR | Status: DC | PRN

## 2021-05-25 SURGICAL SUPPLY — 50 items
ADH SKN CLS APL DERMABOND .7 (GAUZE/BANDAGES/DRESSINGS) ×2
APL PRP STRL LF DISP 70% ISPRP (MISCELLANEOUS) ×1
BAG COUNTER SPONGE SURGICOUNT (BAG) IMPLANT
BAG SPEC THK2 15X12 ZIP CLS (MISCELLANEOUS)
BAG SPNG CNTER NS LX DISP (BAG)
BAG ZIPLOCK 12X15 (MISCELLANEOUS) IMPLANT
BIT DRILL CANN LG 4.3MM (BIT) IMPLANT
CHLORAPREP W/TINT 26 (MISCELLANEOUS) ×2 IMPLANT
COVER PERINEAL POST (MISCELLANEOUS) ×2 IMPLANT
COVER SURGICAL LIGHT HANDLE (MISCELLANEOUS) ×2 IMPLANT
DERMABOND ADVANCED (GAUZE/BANDAGES/DRESSINGS) ×2
DERMABOND ADVANCED .7 DNX12 (GAUZE/BANDAGES/DRESSINGS) ×2 IMPLANT
DRAPE C-ARM 42X120 X-RAY (DRAPES) ×2 IMPLANT
DRAPE C-ARMOR (DRAPES) ×2 IMPLANT
DRAPE IMP U-DRAPE 54X76 (DRAPES) ×4 IMPLANT
DRAPE SHEET LG 3/4 BI-LAMINATE (DRAPES) ×4 IMPLANT
DRAPE STERI IOBAN 125X83 (DRAPES) ×2 IMPLANT
DRAPE U-SHAPE 47X51 STRL (DRAPES) ×4 IMPLANT
DRESSING MEPILEX FLEX 4X4 (GAUZE/BANDAGES/DRESSINGS) ×2 IMPLANT
DRILL BIT CANN LG 4.3MM (BIT) ×2
DRSG AQUACEL AG ADV 3.5X 4 (GAUZE/BANDAGES/DRESSINGS) ×1 IMPLANT
DRSG AQUACEL AG ADV 3.5X 6 (GAUZE/BANDAGES/DRESSINGS) ×1 IMPLANT
DRSG MEPILEX BORDER 4X8 (GAUZE/BANDAGES/DRESSINGS) IMPLANT
DRSG MEPILEX FLEX 4X4 (GAUZE/BANDAGES/DRESSINGS) ×4
ELECT BLADE TIP CTD 4 INCH (ELECTRODE) IMPLANT
FACESHIELD WRAPAROUND (MASK) ×4 IMPLANT
FACESHIELD WRAPAROUND OR TEAM (MASK) ×2 IMPLANT
GAUZE SPONGE 4X4 12PLY STRL (GAUZE/BANDAGES/DRESSINGS) ×2 IMPLANT
GLOVE SRG 8 PF TXTR STRL LF DI (GLOVE) ×1 IMPLANT
GLOVE SURG ENC MOIS LTX SZ8.5 (GLOVE) ×4 IMPLANT
GLOVE SURG ENC TEXT LTX SZ7.5 (GLOVE) ×4 IMPLANT
GLOVE SURG UNDER POLY LF SZ8 (GLOVE) ×2
GLOVE SURG UNDER POLY LF SZ8.5 (GLOVE) ×2 IMPLANT
GOWN SPEC L3 XXLG W/TWL (GOWN DISPOSABLE) ×2 IMPLANT
GUIDEPIN VERSANAIL DSP 3.2X444 (ORTHOPEDIC DISPOSABLE SUPPLIES) ×1 IMPLANT
HFN 125 DEG 11MM X 180MM (Orthopedic Implant) ×1 IMPLANT
HIP FRAC NAIL LAG SCR 10.5X100 (Orthopedic Implant) ×2 IMPLANT
JET LAVAGE IRRISEPT WOUND (IRRIGATION / IRRIGATOR)
KIT BASIN OR (CUSTOM PROCEDURE TRAY) ×2 IMPLANT
KIT TURNOVER KIT A (KITS) ×2 IMPLANT
LAVAGE JET IRRISEPT WOUND (IRRIGATION / IRRIGATOR) IMPLANT
MANIFOLD NEPTUNE II (INSTRUMENTS) ×2 IMPLANT
MARKER SKIN DUAL TIP RULER LAB (MISCELLANEOUS) ×2 IMPLANT
PACK GENERAL/GYN (CUSTOM PROCEDURE TRAY) ×2 IMPLANT
PENCIL SMOKE EVACUATOR (MISCELLANEOUS) IMPLANT
SCREW BONE CORTICAL 5.0X3 (Screw) ×1 IMPLANT
SCREW CANN THRD AFF 10.5X100 (Orthopedic Implant) IMPLANT
SUT MNCRL AB 3-0 PS2 18 (SUTURE) ×2 IMPLANT
SUT VIC AB 1 CT1 36 (SUTURE) ×2 IMPLANT
TOWEL OR 17X26 10 PK STRL BLUE (TOWEL DISPOSABLE) ×2 IMPLANT

## 2021-05-25 NOTE — Anesthesia Preprocedure Evaluation (Addendum)
Anesthesia Evaluation  Patient identified by MRN, date of birth, ID band Patient awake    Reviewed: Allergy & Precautions, NPO status , Patient's Chart, lab work & pertinent test results  Airway Mallampati: II  TM Distance: >3 FB     Dental  (+) Dental Advisory Given   Pulmonary COPD, former smoker,    breath sounds clear to auscultation       Cardiovascular hypertension, Pt. on medications  Rhythm:Regular Rate:Normal     Neuro/Psych negative neurological ROS     GI/Hepatic Neg liver ROS, GERD  ,  Endo/Other  diabetes  Renal/GU Renal disease (s/p right nephrectomy)     Musculoskeletal  (+) Arthritis ,   Abdominal   Peds  Hematology  (+) anemia ,   Anesthesia Other Findings   Reproductive/Obstetrics                             Lab Results  Component Value Date   WBC 8.8 05/24/2021   HGB 11.3 (L) 05/24/2021   HCT 36.2 (L) 05/24/2021   MCV 91.0 05/24/2021   PLT 141 (L) 05/24/2021   Lab Results  Component Value Date   CREATININE 1.08 05/24/2021   BUN 17 05/24/2021   NA 134 (L) 05/24/2021   K 3.6 05/24/2021   CL 101 05/24/2021   CO2 26 05/24/2021    Anesthesia Physical Anesthesia Plan  ASA: 3  Anesthesia Plan: Spinal   Post-op Pain Management:    Induction:   PONV Risk Score and Plan: 1 and Propofol infusion, Ondansetron and Treatment may vary due to age or medical condition  Airway Management Planned: Natural Airway and Simple Face Mask  Additional Equipment:   Intra-op Plan:   Post-operative Plan:   Informed Consent: I have reviewed the patients History and Physical, chart, labs and discussed the procedure including the risks, benefits and alternatives for the proposed anesthesia with the patient or authorized representative who has indicated his/her understanding and acceptance.       Plan Discussed with: CRNA  Anesthesia Plan Comments:         Anesthesia Quick Evaluation

## 2021-05-25 NOTE — Anesthesia Postprocedure Evaluation (Signed)
Anesthesia Post Note  Patient: Hayden Green  Procedure(s) Performed: INTRAMEDULLARY (IM) NAIL INTERTROCHANTRIC (Left)     Patient location during evaluation: PACU Anesthesia Type: Spinal Level of consciousness: awake and alert Pain management: pain level controlled Vital Signs Assessment: post-procedure vital signs reviewed and stable Respiratory status: spontaneous breathing and respiratory function stable Cardiovascular status: blood pressure returned to baseline and stable Postop Assessment: spinal receding Anesthetic complications: no   No notable events documented.  Last Vitals:  Vitals:   05/25/21 1245 05/25/21 1258  BP: 111/63 114/68  Pulse: 90 88  Resp: 14 14  Temp: 36.6 C 36.7 C  SpO2: 99% 99%    Last Pain:  Vitals:   05/25/21 1258  TempSrc: Oral  PainSc: 0-No pain                 Tiajuana Amass

## 2021-05-25 NOTE — Progress Notes (Signed)
Initial Nutrition Assessment  DOCUMENTATION CODES:   Not applicable  INTERVENTION:   -Glucerna Shake po TID, each supplement provides 220 kcal and 10 grams of protein  -MVI with minerals daily  NUTRITION DIAGNOSIS:   Increased nutrient needs related to post-op healing as evidenced by estimated needs.  GOAL:   Patient will meet greater than or equal to 90% of their needs  MONITOR:   PO intake, Supplement acceptance, Labs, Weight trends, Skin, I & O's  REASON FOR ASSESSMENT:   Consult Assessment of nutrition requirement/status, Hip fracture protocol  ASSESSMENT:   Hayden Green is a 87 y.o. male with medical history significant of COPD, essential hypertension, type 2 diabetes mellitus, and history of renal cell carcinoma in remission presents with fall. Patient was climbing a 4-feet ladder to pick apples and fell then could not get back up. Pt reported that he yelled for help.  He left his Medic Alert bracelet inside of home and he was sitting on his phone and could not move.  Patient's neighbor heard and saw and came to his assistance and got him to the ED at [facility]. He is in pain.  Pt admitted with lt hip fracture.   7/21- s/p PROCEDURE: Intramedullary fixation, Left femur.  Reviewed I/O's: +240 ml x 24 hours  UOP: 675 ml x 24 hours  Pt unavailable at time of visit. Attempted to speak with pt via call to hospital room phone, however, unable to reach. RD unable to obtain further nutrition-related history or complete nutrition-focused physical exam at this time.    Reviewed wt hx; wt has been stable over the past year.   Medications reviewed and include colace.  Lab Results  Component Value Date   HGBA1C 5.6 05/24/2021   PTA DM medications are 500 mg metformin BID.   Labs reviewed: CBGS: 101 (inpatient orders for glycemic control are 0-9 units insulin aspart TID with meals and 2 units insulin aspart TID with meals).    Diet Order:   Diet Order             Diet  Carb Modified Fluid consistency: Thin; Room service appropriate? Yes  Diet effective now                   EDUCATION NEEDS:   No education needs have been identified at this time  Skin:  Skin Assessment: Skin Integrity Issues: Skin Integrity Issues:: Incisions Incisions: closed lt thigh  Last BM:  05/24/21  Height:   Ht Readings from Last 1 Encounters:  05/24/21 5\' 6"  (1.676 m)    Weight:   Wt Readings from Last 1 Encounters:  05/24/21 82 kg    Ideal Body Weight:  64.5 kg  BMI:  Body mass index is 29.18 kg/m.  Estimated Nutritional Needs:   Kcal:  1700-1900  Protein:  80-95 grams  Fluid:  > 1.7 L    Loistine Chance, RD, LDN, Rew Registered Dietitian II Certified Diabetes Care and Education Specialist Please refer to [facility] for RD and/or RD on-call/weekend/after hours pager

## 2021-05-25 NOTE — Plan of Care (Signed)

## 2021-05-25 NOTE — Anesthesia Procedure Notes (Signed)
Spinal  Patient location during procedure: OR Start time: 05/25/2021 10:30 AM End time: 05/25/2021 10:36 AM Reason for block: surgical anesthesia Staffing Performed: anesthesiologist  Anesthesiologist: Suzette Battiest, MD Preanesthetic Checklist Completed: patient identified, IV checked, site marked, risks and benefits discussed, surgical consent, monitors and equipment checked, pre-op evaluation and timeout performed Spinal Block Patient position: left lateral decubitus Prep: DuraPrep Patient monitoring: heart rate, cardiac monitor, continuous pulse ox and blood pressure Approach: midline Location: L4-5 Injection technique: single-shot Needle Needle type: Sprotte and Pencan  Needle gauge: 24 G Needle length: 9 cm Assessment Sensory level: T4 Events: CSF return

## 2021-05-25 NOTE — Op Note (Signed)
OPERATIVE REPORT  SURGEON: Rod Can, MD   ASSISTANT: Cherlynn June, PA-C.  PREOPERATIVE DIAGNOSIS: Left intertrochanteric femur fracture.   POSTOPERATIVE DIAGNOSIS: Left intertrochanteric femur fracture.   PROCEDURE: Intramedullary fixation, Left femur.   IMPLANTS: Biomet Affixus Hip Fracture Nail, 11 by 180 mm, 125 degrees. 10.5 x 100 mm Hip Fracture Nail Lag Screw. 5 x 34 mm distal interlocking screw 1.  ANESTHESIA:  MAC and Spinal  ESTIMATED BLOOD LOSS:-50 mL    ANTIBIOTICS: 2 g Ancef.  DRAINS: None.  COMPLICATIONS: None.   CONDITION: PACU - hemodynamically stable.Marland Kitchen   BRIEF CLINICAL NOTE: Hayden Green is a 87 y.o. male who presented with an intertrochanteric femur fracture. The patient was admitted to the hospitalist service and underwent perioperative risk stratification and medical optimization. The risks, benefits, and alternatives to the procedure were explained, and the patient elected to proceed.  PROCEDURE IN DETAIL: Surgical site was marked by myself. The patient was taken to the operating room and anesthesia was induced on the bed. The patient was then transferred to the [facility] table and the nonoperative lower extremity was scissored underneath the operative side. The fracture was reduced with traction, internal rotation, and adduction. The hip was prepped and draped in the normal sterile surgical fashion. Timeout was called verifying side and site of surgery. Preop antibiotics were given with 60 minutes of beginning the procedure.  Fluoroscopy was used to define the patient's anatomy. A 4 cm incision was made just proximal to the tip of the greater trochanter. The awl was used to obtain the standard starting point for a trochanteric entry nail under fluoroscopic control. The guidepin was placed. The entry reamer was used to open the proximal femur.  On the back table, the nail was assembled onto the jig. The nail was placed into the femur without any difficulty.  Through a separate stab incision, the cannula was placed down to the bone in preparation for the cephalomedullary device. A guidepin was placed into the femoral head using AP and lateral fluoroscopy views. The pin was measured, and then reaming was performed to the appropriate depth. The lag screw was inserted to the appropriate depth. The fracture was compressed through the jig. The setscrew was tightened. A separate stab incision was created, and the distal interlocking screw was placed using standard AO technique. The jig was removed. Final AP and lateral fluoroscopy views were obtained to confirm fracture reduction and hardware placement. Tip apex distance was appropriate. There was no chondral penetration. The wounds were copiously irrigated with saline. The wound was closed in layers with #1 Vicryl for the fascia, 2-0 Monocryl for the deep dermal layer, and 3-0 Monocryl subcuticular stitch. Glue was applied to the skin. Once the glue was fully hardened, sterile dressing was applied. The patient was then awakened from anesthesia and taken to the PACU in stable condition. Sponge needle and instrument counts were correct at the end of the case 2. There were no known complications.  We will readmit the patient to the hospitalist. Weightbearing status will be weightbearing as tolerated with a walker. We will begin ASA for DVT prophylaxis. The patient will work with physical therapy and undergo disposition planning.  Please note that a surgical assistant was a medical necessity for this procedure to perform it in a safe and expeditious manner. Assistant was necessary to provide appropriate retraction of vital neurovascular structures, to prevent femoral fracture, and to allow for anatomic placement of the prosthesis.

## 2021-05-25 NOTE — Progress Notes (Signed)
[facility] PROGRESS NOTE   Hayden Green VOJ:[id number] DOB: [birth year] DOA: 05/24/2021  PCP: Rosita Fire, MD  Brief History/Interval Summary: 87 y.o. male with medical history significant of COPD, essential hypertension, type 2 diabetes mellitus, and history of renal cell carcinoma in remission presented after sustaining a fall while he was on 4 feet ladder to pick apples.  Taken to the emergency department at [facility] where he was found to have fracture of his left femur.  Subsequently transferred over to [facility] for further management.     Consultants: Orthopedics  Procedures: None yet  Antibiotics: Anti-infectives (From admission, onward)    Start     Dose/Rate Route Frequency Ordered Stop   05/25/21 0930  ceFAZolin (ANCEF) 2 g in sodium chloride 0.9 % 100 mL IVPB        2 g 200 mL/hr over 30 Minutes Intravenous On call to O.R. 05/24/21 1921 05/25/21 1036   05/25/21 0600  ceFAZolin (ANCEF) 2 g in sodium chloride 0.9 % 100 mL IVPB  Status:  Discontinued        2 g 200 mL/hr over 30 Minutes Intravenous On call to O.R. 05/24/21 2311 05/24/21 2319       Subjective/Interval History: Patient denies any significant pain in his left hip area currently.  Denies any chest pain shortness of breath nausea or vomiting.     Assessment/Plan:  Left intertrochanteric fracture Secondary to mechanical fall.  Orthopedics is following.  Surgery is planned for today.  Does not need any cardiac testing prior to surgery.  Diabetes mellitus type 2 with diabetic retinopathy Holding metformin.  Monitor CBGs.  HbA1c 5.6  History of glaucoma and diabetic retinopathy Continue with eyedrops.  Essential hypertension Continue with home medication regimen including lisinopril HCTZ.  Monitor blood pressures closely.  History of COPD Seems to be stable from a respiratory standpoint.  History of renal cell carcinoma in remission Stable  History of GERD Continue  PPI  Normocytic anemia Mild drop in hemoglobin likely due to fracture.  Recheck CBC tomorrow morning.  Mild hyponatremia Resolved.    DVT Prophylaxis: Definitive chemoprophylaxis after surgery Code Status: Full code Family Communication: Discussed with patient.  No family at bedside Disposition Plan: May need to go to skilled nursing facility for short-term rehab  Status is: Inpatient  Remains inpatient appropriate because:Ongoing active pain requiring inpatient pain management and Inpatient level of care appropriate due to severity of illness  Dispo: The patient is from: Home              Anticipated d/c is to: SNF              Patient currently is not medically stable to d/c.   Difficult to place patient No        Medications: Scheduled:  [MAR Hold] brinzolamide  1 drop Both Eyes TID   And   [MAR Hold] brimonidine  1 drop Both Eyes TID   [MAR Hold] finasteride  5 mg Oral Daily   [MAR Hold] lisinopril  20 mg Oral Daily   And   [MAR Hold] hydrochlorothiazide  12.5 mg Oral Daily   [MAR Hold] insulin aspart  0-9 Units Subcutaneous TID WC   [MAR Hold] insulin aspart  2 Units Subcutaneous TID WC   [MAR Hold] ketorolac  1 drop Right Eye QID   [MAR Hold] latanoprost  1 drop Both Eyes QHS   [MAR Hold] mupirocin ointment  1 application Nasal BID   [MAR Hold]  pantoprazole  40 mg Oral Daily   Continuous:  sodium chloride 300 mL/hr at 05/24/21 1831   lactated ringers 50 mL/hr at 05/25/21 0924   PRN:[MAR Hold] albuterol, [MAR Hold] bisacodyl, [MAR Hold] HYDROcodone-acetaminophen, [MAR Hold] meclizine, [MAR Hold]  morphine injection, sodium chloride irrigation   Objective:  Vital Signs  Vitals:   05/24/21 2128 05/25/21 0240 05/25/21 0552 05/25/21 0914  BP: 119/62 (!) 97/52 97/66 108/61  Pulse: 88 68 67 77  Resp: 17 17 15 15   Temp: 98.4 F (36.9 C) 97.9 F (36.6 C) 98.3 F (36.8 C) 98.2 F (36.8 C)  TempSrc: Oral Oral Oral Oral  SpO2: 98% 97% 97% 93%  Weight:       Height:        Intake/Output Summary (Last 24 hours) at 05/25/2021 1127 Last data filed at 05/25/2021 1043 Gross per 24 hour  Intake 913.57 ml  Output 1475 ml  Net -561.43 ml   Filed Weights   05/24/21 1039  Weight: 82 kg    General appearance: Awake alert.  In no distress Resp: Clear to auscultation bilaterally.  Normal effort Cardio: S1-S2 is normal regular.  No S3-S4.  No rubs murmurs or bruit GI: Abdomen is soft.  Nontender nondistended.  Bowel sounds are present normal.  No masses organomegaly Extremities: No edema.  Neurologic:   No focal neurological deficits.    Lab Results:  Data Reviewed: I have personally reviewed following labs and imaging studies  CBC: Recent Labs  Lab 05/24/21 1245 05/25/21 0323  WBC 8.8 9.0  NEUTROABS 7.0  --   HGB 11.3* 9.8*  HCT 36.2* 32.0*  MCV 91.0 91.2  PLT 141* 132*    Basic Metabolic Panel: Recent Labs  Lab 05/24/21 1245 05/25/21 0323  NA 134* 136  K 3.6 3.7  CL 101 107  CO2 26 23  GLUCOSE 149* 142*  BUN 17 15  CREATININE 1.08 1.05  CALCIUM 9.6 9.1  MG  --  1.7    GFR: Estimated Creatinine Clearance: 46.1 mL/min (by C-G formula based on SCr of 1.05 mg/dL).  Liver Function Tests: Recent Labs  Lab 05/24/21 1245 05/25/21 0323  AST 22 21  ALT 11 10  ALKPHOS 67 54  BILITOT 0.8 0.9  PROT 6.5 5.8*  ALBUMIN 4.0 3.5     HbA1C: Recent Labs    05/24/21 1247  HGBA1C 5.6    CBG: Recent Labs  Lab 05/24/21 1829 05/24/21 2132 05/25/21 0244 05/25/21 0745  GLUCAP 119* 168* 142* 101*     Recent Results (from the past 240 hour(s))  Resp Panel by RT-PCR (Flu A&B, Covid) Nasopharyngeal Swab     Status: None   Collection Time: 05/24/21 12:34 PM   Specimen: Nasopharyngeal Swab; Nasopharyngeal(NP) swabs in vial transport medium  Result Value Ref Range Status   SARS Coronavirus 2 by RT PCR NEGATIVE NEGATIVE Final    Comment: (NOTE) SARS-CoV-2 target nucleic acids are NOT DETECTED.  The SARS-CoV-2 RNA is  generally detectable in upper respiratory specimens during the acute phase of infection. The lowest concentration of SARS-CoV-2 viral copies this assay can detect is 138 copies/mL. A negative result does not preclude SARS-Cov-2 infection and should not be used as the sole basis for treatment or other patient management decisions. A negative result may occur with  improper specimen collection/handling, submission of specimen other than nasopharyngeal swab, presence of viral mutation(s) within the areas targeted by this assay, and inadequate number of viral copies(<138 copies/mL). A negative result must be  combined with clinical observations, patient history, and epidemiological information. The expected result is Negative.  Fact Sheet for Patients:  EntrepreneurPulse.com.au  Fact Sheet for Healthcare Providers:  IncredibleEmployment.be  This test is no t yet approved or cleared by the [place] FDA and  has been authorized for detection and/or diagnosis of SARS-CoV-2 by FDA under an Emergency Use Authorization (EUA). This EUA will remain  in effect (meaning this test can be used) for the duration of the COVID-19 declaration under Section 564(b)(1) of the Act, 21 U.S.C.section 360bbb-3(b)(1), unless the authorization is terminated  or revoked sooner.       Influenza A by PCR NEGATIVE NEGATIVE Final   Influenza B by PCR NEGATIVE NEGATIVE Final    Comment: (NOTE) The Xpert Xpress SARS-CoV-2/FLU/RSV plus assay is intended as an aid in the diagnosis of influenza from Nasopharyngeal swab specimens and should not be used as a sole basis for treatment. Nasal washings and aspirates are unacceptable for Xpert Xpress SARS-CoV-2/FLU/RSV testing.  Fact Sheet for Patients: EntrepreneurPulse.com.au  Fact Sheet for Healthcare Providers: IncredibleEmployment.be  This test is not yet approved or cleared by the [place] FDA and has been authorized for detection and/or diagnosis of SARS-CoV-2 by FDA under an Emergency Use Authorization (EUA). This EUA will remain in effect (meaning this test can be used) for the duration of the COVID-19 declaration under Section 564(b)(1) of the Act, 21 U.S.C. section 360bbb-3(b)(1), unless the authorization is terminated or revoked.  Performed at [facility], [address] [place], [place] [address]   Surgical PCR screen     Status: None   Collection Time: 05/24/21  6:44 PM   Specimen: Nasal Mucosa; Nasal Swab  Result Value Ref Range Status   MRSA, PCR NEGATIVE NEGATIVE Final   Staphylococcus aureus NEGATIVE NEGATIVE Final    Comment: (NOTE) The Xpert SA Assay (FDA approved for NASAL specimens in patients 36 years of age and older), is one component of a comprehensive surveillance program. It is not intended to diagnose infection nor to guide or monitor treatment. Performed at [facility], [place] [address] [place], [place] [address]       Radiology Studies: DG Chest 1 View  Result Date: 05/24/2021 CLINICAL DATA:  Pt had a fall earlier today from a ladder. Hx of COPD, HTN, pneumonia,and former smoker. Covid test pending. EXAM: CHEST  1 VIEW COMPARISON:  09/24/2016 FINDINGS: Relatively low lung volumes with resultant crowding of bronchovascular structures, and suspicion of mild central pulmonary vascular congestion. 7 mm nodular density projects over the mid right lung, present since 2013 presumably benign. Heart size upper limits normal for technique. Aortic Atherosclerosis (ICD10-170.0). No effusion.  No pneumothorax. Visualized bones unremarkable. Cholecystectomy clips. Stable sclerosis in the right humeral head. IMPRESSION: Low volumes, possible mild pulmonary vascular congestion Electronically Signed   By: Lucrezia Europe M.D.   On: 05/24/2021 13:43   DG Hip Unilat With Pelvis 2-3 Views Left  Result Date: 05/24/2021 CLINICAL DATA:  Left  hip pain post fall off ladder EXAM: DG HIP (WITH OR WITHOUT PELVIS) 2-3V LEFT COMPARISON:  CT 07/02/2020 FINDINGS: Comminuted left intertrochanteric fracture with mild varus deformity. No dislocation. Pelvic ring appears intact. IMPRESSION: Comminuted left IT femur fracture Electronically Signed   By: Lucrezia Europe M.D.   On: 05/24/2021 13:55       LOS: 1 day   [facility] Pager on [email or web address]  05/25/2021, 11:27 AM

## 2021-05-25 NOTE — Consult Note (Signed)
ORTHOPAEDIC CONSULTATION  REQUESTING PHYSICIAN: Bonnielee Haff, MD  PCP:  Rosita Fire, MD  Chief Complaint: left hip injury  HPI: Hayden Green is a 87 y.o. male with medical history significant of COPD, essential hypertension, type 2 diabetes mellitus, and history of renal cell carcinoma in remission presents with comminuted left intertrochanteric femur fracture after falling 4 feet off of a ladder while picking apples.  He had left hip pain and inability to weight-bear.  He was brought to the emergency department in [facility], where x-rays revealed the above-mentioned injury.  He was admitted to the hospitalist service for perioperative medical risk stratification and medical optimization.  Orthopedic consultation was placed for management of his left hip fracture.  He denies other injuries.  Past Medical History:  Diagnosis Date   Anemia, unspecified 05/24/2021   Arthritis    COPD (chronic obstructive pulmonary disease) (HCC)    Diabetes mellitus without complication (HCC)    GERD (gastroesophageal reflux disease)    Heartburn    History of renal cell carcinoma 05/24/2021   Hypertension    Pneumonia    Renal cell carcinoma (HCC)    Past Surgical History:  Procedure Laterality Date   CHOLECYSTECTOMY     ESOPHAGOGASTRODUODENOSCOPY N/A 08/07/2017   Dr. Gala Romney: web in proximal esophagus, moderate acquired Schatzki's ring s/p dilation with 77 and 66 F, three-quarter bites with biopsy forceps to facilitate disruption. small hiatal hernia, duodenum normal   MALONEY DILATION N/A 08/07/2017   Procedure: MALONEY DILATION;  Surgeon: Daneil Dolin, MD;  Location: [facility];  Service: Endoscopy;  Laterality: N/A;   right nephrectomy     Social History   Socioeconomic History   Marital status: Married    Spouse name: Not on file   Number of children: 4   Years of education: Not on file   Highest education level: Not on file  Occupational History   Occupation: retired  Theme park manager    Comment: came out of retirement  Tobacco Use   Smoking status: Former    Years: 25.00    Types: Cigarettes    Quit date: 07/06/2005    Years since quitting: 15.8   Smokeless tobacco: Former  Scientific laboratory technician Use: Never used  Substance and Sexual Activity   Alcohol use: No   Drug use: No   Sexual activity: Not on file  Other Topics Concern   Not on file  Social History Narrative   Not on file   Social Determinants of Health   Financial Resource Strain: Not on file  Food Insecurity: Not on file  Transportation Needs: Not on file  Physical Activity: Not on file  Stress: Not on file  Social Connections: Not on file   Family History  Problem Relation Age of Onset   Colon cancer Neg Hx    Colon polyps Neg Hx    Allergies  Allergen Reactions   Reglan [Metoclopramide] Swelling and Other (See Comments)    Caused oral swelling-states that this medication was taken once or twice in the past and the reaction was the same each time   Prior to Admission medications   Medication Sig Start Date End Date Taking? Authorizing Provider  albuterol (PROVENTIL HFA;VENTOLIN HFA) 108 (90 BASE) MCG/ACT inhaler Inhale 2 puffs into the lungs every 6 (six) hours as needed for wheezing or shortness of breath.    Yes [provider]  finasteride (PROSCAR) 5 MG tablet Take 1 tablet (5 mg total) by  mouth daily. 01/11/21  Yes McKenzie, Candee Furbish, MD  ketorolac (ACULAR) 0.5 % ophthalmic solution Place 1 drop into the right eye 4 (four) times daily. 05/19/21  Yes [provider]  lisinopril-hydrochlorothiazide (ZESTORETIC) 20-12.5 MG tablet Take 1 tablet by mouth daily. 01/19/20  Yes [provider]  LUMIGAN 0.01 % SOLN Place 1 drop into both eyes at bedtime. 02/24/21  Yes [provider]  meclizine (ANTIVERT) 25 MG tablet Take 25 mg by mouth 4 (four) times daily as needed for dizziness.   Yes [provider]  metFORMIN (GLUCOPHAGE) 500 MG tablet Take 1  tablet (500 mg total) by mouth 2 (two) times daily with a meal. 09/11/11  Yes Fanta, Tesfaye, MD  naproxen sodium (ANAPROX) 220 MG tablet Take 220 mg by mouth daily as needed (pain).   Yes [provider]  omeprazole (PRILOSEC) 20 MG capsule Take 20 mg by mouth daily as needed (for acid reflux).   Yes [provider]  SIMBRINZA 1-0.2 % SUSP Place 1 drop into both eyes 2 (two) times daily. 06/08/17  Yes [provider]  VYZULTA 0.024 % SOLN Place 1 drop into both eyes at bedtime. 02/11/18  Yes [provider]  Accu-Chek FastClix Lancets MISC  03/03/20   [provider]  ACCU-CHEK GUIDE test strip  03/03/20   [provider]   DG Chest 1 View  Result Date: 05/24/2021 CLINICAL DATA:  Pt had a fall earlier today from a ladder. Hx of COPD, HTN, pneumonia,and former smoker. Covid test pending. EXAM: CHEST  1 VIEW COMPARISON:  09/24/2016 FINDINGS: Relatively low lung volumes with resultant crowding of bronchovascular structures, and suspicion of mild central pulmonary vascular congestion. 7 mm nodular density projects over the mid right lung, present since 2013 presumably benign. Heart size upper limits normal for technique. Aortic Atherosclerosis (ICD10-170.0). No effusion.  No pneumothorax. Visualized bones unremarkable. Cholecystectomy clips. Stable sclerosis in the right humeral head. IMPRESSION: Low volumes, possible mild pulmonary vascular congestion Electronically Signed   By: Lucrezia Europe M.D.   On: 05/24/2021 13:43   DG Hip Unilat With Pelvis 2-3 Views Left  Result Date: 05/24/2021 CLINICAL DATA:  Left hip pain post fall off ladder EXAM: DG HIP (WITH OR WITHOUT PELVIS) 2-3V LEFT COMPARISON:  CT 07/02/2020 FINDINGS: Comminuted left intertrochanteric fracture with mild varus deformity. No dislocation. Pelvic ring appears intact. IMPRESSION: Comminuted left IT femur fracture Electronically Signed   By: Lucrezia Europe M.D.   On: 05/24/2021 13:55    Positive ROS:  All other systems have been reviewed and were otherwise negative with the exception of those mentioned in the HPI and as above.  Physical Exam: General: Alert, no acute distress Cardiovascular: No pedal edema Respiratory: No cyanosis, no use of accessory musculature GI: No organomegaly, abdomen is soft and non-tender Skin: No lesions in the area of chief complaint Neurologic: Sensation intact distally Psychiatric: Patient is competent for consent with normal mood and affect Lymphatic: No axillary or cervical lymphadenopathy  MUSCULOSKELETAL: Examination of the left hip reveals that his skin is intact.  He has pain with logrolling of the hip.  He has 2+ palpable pedal pulses.  Positive motor function dorsiflexion, plantarflexion, and great toe extension.  Assessment: Comminuted left extracapsular peritrochanteric left intertrochanteric fracture  Plan: I discussed the findings with the patient.  He has an unstable, displaced left hip fracture that requires surgical stabilization.  We discussed the risk, benefits, and alternatives to intramedullary fixation.  Plan for surgery today.  The risks, benefits, and alternatives were discussed with the patient. There are risks associated with the surgery including, but not limited to, problems with anesthesia (death), infection, differences in leg length/angulation/rotation, fracture of bones, loosening or failure of implants, malunion, nonunion, hematoma (blood accumulation) which may require surgical drainage, blood clots, pulmonary embolism, nerve injury (foot drop), and blood vessel injury. The patient understands these risks and elects to proceed.     Bertram Savin, MD [phone]    05/25/2021 9:30 AM

## 2021-05-25 NOTE — Progress Notes (Signed)
Aggie Hacker (daughter) contact # [phone]. Closest family member in the vicinity.

## 2021-05-25 NOTE — Discharge Instructions (Signed)
 Dr. Jaryan Chicoine [facility] Knee Specialist [facility] [address] [place], [place] [address] [phone]   POSTOPERATIVE DIRECTIONS    Hip Rehabilitation, Guidelines Following Surgery   WEIGHT BEARING Weight bearing as tolerated with assist device (walker, cane, etc) as directed, use it as long as suggested by your surgeon or therapist, typically at least 4-6 weeks.   HOME CARE INSTRUCTIONS  Remove items at home which could result in a fall. This includes throw rugs or furniture in walking pathways.  Continue medications as instructed at time of discharge.  You may have some home medications which will be placed on hold until you complete the course of blood thinner medication.  4 days after discharge, you may start showering. No tub baths or soaking your incisions. Do not put on socks or shoes without following the instructions of your caregivers.   Sit on chairs with arms. Use the chair arms to help push yourself up when arising.  Arrange for the use of a toilet seat elevator so you are not sitting low.   Walk with walker as instructed.  You may resume a sexual relationship in one month or when given the OK by your caregiver.  Use walker as long as suggested by your caregivers.  Avoid periods of inactivity such as sitting longer than an hour when not asleep. This helps prevent blood clots.  You may return to work once you are cleared by your surgeon.  Do not drive a car for 6 weeks or until released by your surgeon.  Do not drive while taking narcotics.  Wear elastic stockings for two weeks following surgery during the day but you may remove then at night.  Make sure you keep all of your appointments after your operation with all of your doctors and caregivers. You should call the office at the above phone number and make an appointment for approximately two weeks after the date of your surgery. Please pick up a stool softener and laxative  for home use as long as you are requiring pain medications.  ICE to the affected hip every three hours for 30 minutes at a time and then as needed for pain and swelling. Continue to use ice on the hip for pain and swelling from surgery. You may notice swelling that will progress down to the foot and ankle.  This is normal after surgery.  Elevate the leg when you are not up walking on it.   It is important for you to complete the blood thinner medication as prescribed by your doctor.  Continue to use the breathing machine which will help keep your temperature down.  It is common for your temperature to cycle up and down following surgery, especially at night when you are not up moving around and exerting yourself.  The breathing machine keeps your lungs expanded and your temperature down.  RANGE OF MOTION AND STRENGTHENING EXERCISES  These exercises are designed to help you keep full movement of your hip joint. Follow your caregiver's or physical therapist's instructions. Perform all exercises about fifteen times, three times per day or as directed. Exercise both hips, even if you have had only one joint replacement. These exercises can be done on a training (exercise) mat, on the floor, on a table or on a bed. Use whatever works the best and is most comfortable for you. Use music or television while you are exercising so that the exercises are a pleasant break in your day. This   will make your life better with the exercises acting as a break in routine you can look forward to.  Lying on your back, slowly slide your foot toward your buttocks, raising your knee up off the floor. Then slowly slide your foot back down until your leg is straight again.  Lying on your back spread your legs as far apart as you can without causing discomfort.  Lying on your side, raise your upper leg and foot straight up from the floor as far as is comfortable. Slowly lower the leg and repeat.  Lying on your back, tighten up the  muscle in the front of your thigh (quadriceps muscles). You can do this by keeping your leg straight and trying to raise your heel off the floor. This helps strengthen the largest muscle supporting your knee.  Lying on your back, tighten up the muscles of your buttocks both with the legs straight and with the knee bent at a comfortable angle while keeping your heel on the floor.   SKILLED REHAB INSTRUCTIONS: If the patient is transferred to a skilled rehab facility following release from the hospital, a list of the current medications will be sent to the facility for the patient to continue.  When discharged from the skilled rehab facility, please have the facility set up the patient's Home Health Physical Therapy prior to being released. Also, the skilled facility will be responsible for providing the patient with their medications at time of release from the facility to include their pain medication and their blood thinner medication. If the patient is still at the rehab facility at time of the two week follow up appointment, the skilled rehab facility will also need to assist the patient in arranging follow up appointment in our office and any transportation needs.  MAKE SURE YOU:  Understand these instructions.  Will watch your condition.  Will get help right away if you are not doing well or get worse.  Pick up stool softner and laxative for home use following surgery while on pain medications. Daily dry dressing changes as needed. In 4 days, you may remove your dressings and begin taking showers - no tub baths or soaking the incisions. Continue to use ice for pain and swelling after surgery. Do not use any lotions or creams on the incision until instructed by your surgeon.   

## 2021-05-25 NOTE — Transfer of Care (Signed)
Immediate Anesthesia Transfer of Care Note  Patient: Hayden Green  Procedure(s) Performed: Procedure(s): INTRAMEDULLARY (IM) NAIL INTERTROCHANTRIC (Left)  Patient Location: PACU  Anesthesia Type:Spinal  Level of Consciousness:  sedated, patient cooperative and responds to stimulation  Airway & Oxygen Therapy:Patient Spontanous Breathing and Patient connected to face mask oxgen  Post-op Assessment:  Report given to PACU RN and Post -op Vital signs reviewed and stable  Post vital signs:  Reviewed and stable  Last Vitals:  Vitals:   05/25/21 0914 05/25/21 1200  BP: 108/61 (!) 88/52  Pulse: 77 100  Resp: 15 10  Temp: 36.8 C   SpO2: 83% 43%    Complications: No apparent anesthesia complications

## 2021-05-26 ENCOUNTER — Encounter (HOSPITAL_COMMUNITY): Payer: Self-pay | Admitting: Orthopedic Surgery

## 2021-05-26 DIAGNOSIS — E119 Type 2 diabetes mellitus without complications: Secondary | ICD-10-CM | POA: Diagnosis not present

## 2021-05-26 DIAGNOSIS — I10 Essential (primary) hypertension: Secondary | ICD-10-CM | POA: Diagnosis not present

## 2021-05-26 DIAGNOSIS — S72002A Fracture of unspecified part of neck of left femur, initial encounter for closed fracture: Secondary | ICD-10-CM | POA: Diagnosis not present

## 2021-05-26 DIAGNOSIS — D649 Anemia, unspecified: Secondary | ICD-10-CM | POA: Diagnosis not present

## 2021-05-26 LAB — CBC
HCT: 29.4 % — ABNORMAL LOW (ref 39.0–52.0)
Hemoglobin: 9.1 g/dL — ABNORMAL LOW (ref 13.0–17.0)
MCH: 28.4 pg (ref 26.0–34.0)
MCHC: 31 g/dL (ref 30.0–36.0)
MCV: 91.9 fL (ref 80.0–100.0)
Platelets: 115 10*3/uL — ABNORMAL LOW (ref 150–400)
RBC: 3.2 MIL/uL — ABNORMAL LOW (ref 4.22–5.81)
RDW: 12.1 % (ref 11.5–15.5)
WBC: 11.4 10*3/uL — ABNORMAL HIGH (ref 4.0–10.5)
nRBC: 0 % (ref 0.0–0.2)

## 2021-05-26 LAB — BASIC METABOLIC PANEL
Anion gap: 3 — ABNORMAL LOW (ref 5–15)
BUN: 16 mg/dL (ref 8–23)
CO2: 25 mmol/L (ref 22–32)
Calcium: 8.7 mg/dL — ABNORMAL LOW (ref 8.9–10.3)
Chloride: 109 mmol/L (ref 98–111)
Creatinine, Ser: 1.09 mg/dL (ref 0.61–1.24)
GFR, Estimated: >60 mL/min (ref >60)
Glucose, Bld: 194 mg/dL — ABNORMAL HIGH (ref 70–99)
Potassium: 3.8 mmol/L (ref 3.5–5.1)
Sodium: 137 mmol/L (ref 135–145)

## 2021-05-26 LAB — GLUCOSE, CAPILLARY
Glucose-Capillary: 201 mg/dL — ABNORMAL HIGH (ref 70–99)
Glucose-Capillary: 125 mg/dL — ABNORMAL HIGH (ref 70–99)
Glucose-Capillary: 166 mg/dL — ABNORMAL HIGH (ref 70–99)
Glucose-Capillary: 163 mg/dL — ABNORMAL HIGH (ref 70–99)
Glucose-Capillary: 155 mg/dL — ABNORMAL HIGH (ref 70–99)

## 2021-05-26 MED ORDER — HYDROCODONE-ACETAMINOPHEN 5-325 MG PO TABS: 1.0000 | ORAL_TABLET | ORAL | 0 refills | Status: DC | PRN

## 2021-05-26 MED ORDER — ASPIRIN 81 MG PO CHEW: 81.0000 mg | CHEWABLE_TABLET | Freq: Two times a day (BID) | ORAL | 0 refills | Status: AC

## 2021-05-26 MED ORDER — METHOCARBAMOL 500 MG PO TABS
500.0000 mg | ORAL_TABLET | Freq: Three times a day (TID) | ORAL | Status: DC | PRN
  Administered 2021-05-27: 500 mg via ORAL
  Filled 2021-05-26: qty 1

## 2021-05-26 MED ORDER — ASPIRIN 81 MG PO CHEW: 81.0000 mg | CHEWABLE_TABLET | Freq: Two times a day (BID) | ORAL | 0 refills | Status: DC

## 2021-05-26 MED ORDER — HYDROCODONE-ACETAMINOPHEN 5-325 MG PO TABS: 1.0000 | ORAL_TABLET | ORAL | 0 refills | Status: AC | PRN

## 2021-05-26 NOTE — Progress Notes (Signed)
[facility] PROGRESS NOTE   Hayden Green [id number] DOB: 1927-11-07 DOA: 05/24/2021  PCP: Rosita Fire, MD  Brief History/Interval Summary: 87 y.o. male with medical history significant of COPD, essential hypertension, type 2 diabetes mellitus, and history of renal cell carcinoma in remission presented after sustaining a fall while he was on 4 feet ladder to pick apples.  Taken to the emergency department at [facility] where he was found to have fracture of his left femur.  Subsequently transferred over to [facility] for further management.     Consultants: Orthopedics  Procedures: Intramedullary fixation, Left femur. 7/21  Antibiotics: Anti-infectives (From admission, onward)    Start     Dose/Rate Route Frequency Ordered Stop   05/25/21 1600  ceFAZolin (ANCEF) 2 g in sodium chloride 0.9 % 100 mL IVPB        2 g 200 mL/hr over 30 Minutes Intravenous Every 6 hours 05/25/21 1302 05/25/21 2248   05/25/21 0930  ceFAZolin (ANCEF) 2 g in sodium chloride 0.9 % 100 mL IVPB        2 g 200 mL/hr over 30 Minutes Intravenous On call to O.R. 05/24/21 1921 05/25/21 1036   05/25/21 0600  ceFAZolin (ANCEF) 2 g in sodium chloride 0.9 % 100 mL IVPB  Status:  Discontinued        2 g 200 mL/hr over 30 Minutes Intravenous On call to O.R. 05/24/21 2311 05/24/21 2319       Subjective/Interval History: Patient mentions that he is feeling well.  Denies any shortness of breath or chest pain.  Pain in the left hip area is well controlled.     Assessment/Plan:  Left intertrochanteric fracture Secondary to mechanical fall.  Seen by orthopedics and underwent surgery on 7/21.  PT and OT evaluation is pending.  Pain reasonably well controlled.  Continue to monitor.    Diabetes mellitus type 2 with diabetic retinopathy Holding metformin.  CBGs are reasonably well controlled.  Continue SSI.  HbA1c 5.6  History of glaucoma and diabetic retinopathy Continue with eyedrops.  Essential  hypertension Continue with home medication regimen including lisinopril HCTZ.  Blood pressure is reasonably well controlled.  History of COPD Seems to be stable from a respiratory standpoint.  History of renal cell carcinoma in remission Stable  History of GERD Continue PPI  Normocytic anemia/postoperative anemia due to blood loss Drop in hemoglobin likely multifactorial including blood loss from surgery and fracture.  Transfuse if it drops below 7.    Mild hyponatremia Resolved.    DVT Prophylaxis: Orthopedics has initiated aspirin for DVT prophylaxis. Code Status: Full code Family Communication: Discussed with patient.  No family at bedside Disposition Plan: Await PT and OT evaluation.  May need to go to skilled nursing facility for short-term rehab.  Status is: Inpatient  Remains inpatient appropriate because:Ongoing active pain requiring inpatient pain management and Inpatient level of care appropriate due to severity of illness  Dispo: The patient is from: Home              Anticipated d/c is to: SNF              Patient currently is not medically stable to d/c.   Difficult to place patient No        Medications: Scheduled:  aspirin EC  325 mg Oral Q breakfast   brinzolamide  1 drop Both Eyes TID   And   brimonidine  1 drop Both Eyes TID   diphenhydrAMINE  6.5  mg Intravenous Once   docusate sodium  100 mg Oral BID   feeding supplement (GLUCERNA SHAKE)  237 mL Oral TID BM   lisinopril  20 mg Oral Daily   And   hydrochlorothiazide  12.5 mg Oral Daily   insulin aspart  0-9 Units Subcutaneous TID WC   insulin aspart  2 Units Subcutaneous TID WC   ketorolac  1 drop Right Eye QID   latanoprost  1 drop Both Eyes QHS   multivitamin with minerals  1 tablet Oral Daily   pantoprazole  40 mg Oral Daily   Continuous:  sodium chloride 300 mL/hr at 05/24/21 1831   [id number], albuterol, bisacodyl, HYDROcodone-acetaminophen, HYDROcodone-acetaminophen,  meclizine, menthol-cetylpyridinium **OR** phenol, morphine injection, ondansetron **OR** ondansetron (ZOFRAN) IV   Objective:  Vital Signs  Vitals:   05/25/21 1747 05/25/21 2220 05/26/21 0100 05/26/21 0532  BP: (!) 148/74 133/63 (!) 105/53 118/70  Pulse: 98 81 73 72  Resp: '18 20 16 18  '$ Temp:  99.1 F (37.3 C) 98.7 F (37.1 C) 98 F (36.7 C)  TempSrc:  Oral Oral Oral  SpO2: 100% 100% 100% 100%  Weight:      Height:        Intake/Output Summary (Last 24 hours) at 05/26/2021 1014 Last data filed at 05/26/2021 0902 Gross per 24 hour  Intake 1990 ml  Output 3050 ml  Net -1060 ml    Filed Weights   05/24/21 1039  Weight: 82 kg    General appearance: Awake alert.  In no distress Resp: Clear to auscultation bilaterally.  Normal effort Cardio: S1-S2 is normal regular.  No S3-S4.  No rubs murmurs or bruit GI: Abdomen is soft.  Nontender nondistended.  Bowel sounds are present normal.  No masses organomegaly Extremities: No significant bruising noted over the left hip area.  Some swelling is noted. Neurologic:No focal neurological deficits.     Lab Results:  Data Reviewed: I have personally reviewed following labs and imaging studies  CBC: Recent Labs  Lab 05/24/21 1245 05/25/21 0323 05/26/21 0321  WBC 8.8 9.0 11.4*  NEUTROABS 7.0  --   --   HGB 11.3* 9.8* 9.1*  HCT 36.2* 32.0* 29.4*  MCV 91.0 91.2 91.9  PLT 141* 132* 115*     Basic Metabolic Panel: Recent Labs  Lab 05/24/21 1245 05/25/21 0323 05/26/21 0321  NA 134* 136 137  K 3.6 3.7 3.8  CL 101 107 109  CO2 '26 23 25  '$ GLUCOSE 149* 142* 194*  BUN '17 15 16  '$ CREATININE 1.08 1.05 1.09  CALCIUM 9.6 9.1 8.7*  MG  --  1.7  --      GFR: Estimated Creatinine Clearance: 44.4 mL/Hayden (by C-G formula based on SCr of 1.09 mg/dL).  Liver Function Tests: Recent Labs  Lab 05/24/21 1245 05/25/21 0323  AST 22 21  ALT 11 10  ALKPHOS 67 54  BILITOT 0.8 0.9  PROT 6.5 5.8*  ALBUMIN 4.0 3.5       HbA1C: Recent Labs    05/24/21 1247  HGBA1C 5.6     CBG: Recent Labs  Lab 05/25/21 0745 05/25/21 1638 05/25/21 2225 05/26/21 0232 05/26/21 0740  GLUCAP 101* 178* 207* 201* 125*      Recent Results (from the past 240 hour(s))  Resp Panel by RT-PCR (Flu A&B, Covid) Nasopharyngeal Swab     Status: None   Collection Time: 05/24/21 12:34 PM   Specimen: Nasopharyngeal Swab; Nasopharyngeal(NP) swabs in vial transport medium  Result Value Ref Range  Status   SARS Coronavirus 2 by RT PCR NEGATIVE NEGATIVE Final    Comment: (NOTE) SARS-CoV-2 target nucleic acids are NOT DETECTED.  The SARS-CoV-2 RNA is generally detectable in upper respiratory specimens during the acute phase of infection. The lowest concentration of SARS-CoV-2 viral copies this assay can detect is 138 copies/mL. A negative result does not preclude SARS-Cov-2 infection and should not be used as the sole basis for treatment or other patient management decisions. A negative result may occur with  improper specimen collection/handling, submission of specimen other than nasopharyngeal swab, presence of viral mutation(s) within the areas targeted by this assay, and inadequate number of viral copies(<138 copies/mL). A negative result must be combined with clinical observations, patient history, and epidemiological information. The expected result is Negative.  Fact Sheet for Patients:  EntrepreneurPulse.com.au  Fact Sheet for Healthcare Providers:  IncredibleEmployment.be  This test is no t yet approved or cleared by the [place] FDA and  has been authorized for detection and/or diagnosis of SARS-CoV-2 by FDA under an Emergency Use Authorization (EUA). This EUA will remain  in effect (meaning this test can be used) for the duration of the COVID-19 declaration under Section 564(b)(1) of the Act, 21 U.S.C.section 360bbb-3(b)(1), unless the authorization is  terminated  or revoked sooner.       Influenza A by PCR NEGATIVE NEGATIVE Final   Influenza B by PCR NEGATIVE NEGATIVE Final    Comment: (NOTE) The Xpert Xpress SARS-CoV-2/FLU/RSV plus assay is intended as an aid in the diagnosis of influenza from Nasopharyngeal swab specimens and should not be used as a sole basis for treatment. Nasal washings and aspirates are unacceptable for Xpert Xpress SARS-CoV-2/FLU/RSV testing.  Fact Sheet for Patients: EntrepreneurPulse.com.au  Fact Sheet for Healthcare Providers: IncredibleEmployment.be  This test is not yet approved or cleared by the [place] FDA and has been authorized for detection and/or diagnosis of SARS-CoV-2 by FDA under an Emergency Use Authorization (EUA). This EUA will remain in effect (meaning this test can be used) for the duration of the COVID-19 declaration under Section 564(b)(1) of the Act, 21 U.S.C. section 360bbb-3(b)(1), unless the authorization is terminated or revoked.  Performed at [facility], [address], [place], [place] [address]   Surgical PCR screen     Status: None   Collection Time: 05/24/21  6:44 PM   Specimen: Nasal Mucosa; Nasal Swab  Result Value Ref Range Status   MRSA, PCR NEGATIVE NEGATIVE Final   Staphylococcus aureus NEGATIVE NEGATIVE Final    Comment: (NOTE) The Xpert SA Assay (FDA approved for NASAL specimens in patients 37 years of age and older), is one component of a comprehensive surveillance program. It is not intended to diagnose infection nor to guide or monitor treatment. Performed at [facility], [place] [address], [place], [place] [address]        Radiology Studies: DG Chest 1 View  Result Date: 05/24/2021 CLINICAL DATA:  Pt had a fall earlier today from a ladder. Hx of COPD, HTN, pneumonia,and former smoker. Covid test pending. EXAM: CHEST  1 VIEW COMPARISON:  09/24/2016 FINDINGS: Relatively low lung volumes with  resultant crowding of bronchovascular structures, and suspicion of mild central pulmonary vascular congestion. 7 mm nodular density projects over the mid right lung, present since 2013 presumably benign. Heart size upper limits normal for technique. Aortic Atherosclerosis (ICD10-170.0). No effusion.  No pneumothorax. Visualized bones unremarkable. Cholecystectomy clips. Stable sclerosis in the right humeral head. IMPRESSION: Low volumes, possible mild pulmonary  vascular congestion Electronically Signed   By: Lucrezia Europe M.D.   On: 05/24/2021 13:43   Pelvis Portable  Result Date: 05/25/2021 CLINICAL DATA:  Status post left hip IM nail. EXAM: PORTABLE PELVIS 1-2 VIEWS COMPARISON:  Preoperative radiograph yesterday FINDINGS: Intramedullary nail with trans trochanteric and distal screw fixation of intertrochanteric femur fracture. No periprosthetic lucency. Recent postsurgical change includes air and edema in the soft tissues as well as skin staples laterally. IMPRESSION: ORIF of intertrochanteric femur fracture without immediate postoperative complication. Electronically Signed   By: Keith Rake M.D.   On: 05/25/2021 14:40   DG C-Arm 1-60 Hayden-No Report  Result Date: 05/25/2021 Fluoroscopy was utilized by the requesting physician.  No radiographic interpretation.   DG HIP OPERATIVE UNILAT W OR W/O PELVIS LEFT  Result Date: 05/25/2021 CLINICAL DATA:  Internal fixation of left femur fracture. EXAM: OPERATIVE LEFT HIP (WITH PELVIS IF PERFORMED) 2 VIEWS TECHNIQUE: Fluoroscopic spot image(s) were submitted for interpretation post-operatively. COMPARISON:  05/24/2021 FINDINGS: Internal fixation of left intertrochanteric hip fracture with a dynamic hip screw. Short intramedullary nail in the proximal left femur with a distal interlocking screw. Left hip is located on these images. IMPRESSION: Internal fixation of left intertrochanteric femur fracture. Electronically Signed   By: Markus Daft M.D.   On: 05/25/2021  12:22   DG Hip Unilat With Pelvis 2-3 Views Left  Result Date: 05/24/2021 CLINICAL DATA:  Left hip pain post fall off ladder EXAM: DG HIP (WITH OR WITHOUT PELVIS) 2-3V LEFT COMPARISON:  CT 07/02/2020 FINDINGS: Comminuted left intertrochanteric fracture with mild varus deformity. No dislocation. Pelvic ring appears intact. IMPRESSION: Comminuted left IT femur fracture Electronically Signed   By: Lucrezia Europe M.D.   On: 05/24/2021 13:55       LOS: 2 days   [facility] Pager on [email or web address]  05/26/2021, 10:14 AM

## 2021-05-26 NOTE — TOC Initial Note (Signed)
Transition of Care [facility]) - Initial/Assessment Note   Patient Details  Name: Hayden Green MRN: [id number] Date of Birth: 1928/09/30  Transition of Care [facility]) CM/SW Contact:    Sherie Don, LCSW Phone Number: 05/26/2021, 1:14 PM  Clinical Narrative: PT evaluation recommended SNF. CSW met with patient and family to discuss recommendations; they are agreeable to SNF but would prefer a facility in [facility] as that is where the patient and family live. Patient/family preference is [facility].  FL2 done; PASRR received. CSW faxed out initial referral and reached out to [facility] directly. Per Threasa Beards, the billing department and admissions can review the referral on Monday.  CSW started insurance authorization in the [place] portal. Clinicals uploaded for review. TOC awaiting bed offers.  Expected Discharge Plan: [place] Barriers to Discharge: Continued Medical Work up, SNF Pending bed offer, Insurance Authorization  Patient Goals and CMS Choice Patient states their goals for this hospitalization and ongoing recovery are:: Go to rehab in [employer].gov Compare Post Acute Care list provided to:: Patient Choice offered to / list presented to : Patient, Spouse  Expected Discharge Plan and Services Expected Discharge Plan: [place] In-house Referral: Clinical Social Work Post Acute Care Choice: [place] Living arrangements for the past 2 months: [place]            DME Arranged: N/A DME Agency: NA  Prior Living Arrangements/Services Living arrangements for the past 2 months: Single Family Home Lives with:: Spouse Patient language and need for interpreter reviewed:: Yes Do you feel safe going back to the place where you live?: Yes      Need for Family Participation in Patient Care: No (Comment) Care giver support system in place?: Yes (comment) Criminal Activity/Legal Involvement Pertinent to  Current Situation/Hospitalization: No - Comment as needed  Activities of Daily Living Home Assistive Devices/Equipment: Cane (specify quad or straight) ADL Screening (condition at time of admission) Patient's cognitive ability adequate to safely complete daily activities?: Yes Is the patient deaf or have difficulty hearing?: No Does the patient have difficulty seeing, even when wearing glasses/contacts?: No Does the patient have difficulty concentrating, remembering, or making decisions?: No Patient able to express need for assistance with ADLs?: Yes Does the patient have difficulty dressing or bathing?: No Independently performs ADLs?: Yes (appropriate for developmental age) Does the patient have difficulty walking or climbing stairs?: No Weakness of Legs: None Weakness of Arms/Hands: None  Permission Sought/Granted Permission sought to share information with : Facility Art therapist granted to share information with : Yes, Verbal Permission Granted Permission granted to share info w AGENCY: SNFs  Emotional Assessment Appearance:: Appears stated age Attitude/Demeanor/Rapport: Engaged Affect (typically observed): Accepting Orientation: : Oriented to Self, Oriented to Place, Oriented to  Time, Oriented to Situation Alcohol / Substance Use: Not Applicable Psych Involvement: No (comment)  Admission diagnosis:  Fall [W19.XXXA] Closed left hip fracture ([place]) [S72.002A] Closed fracture of left hip, initial encounter [facility]) [S72.002A] Patient Active Problem List   Diagnosis Date Noted   Closed left hip fracture ([place]) 05/24/2021   Essential hypertension 05/24/2021   History of renal cell carcinoma 05/24/2021   Diabetes ([place]) 05/24/2021   Open-angle glaucoma 05/24/2021   Diabetic retinopathy ([place]) 05/24/2021   COPD (chronic obstructive pulmonary disease) ([place]) 05/24/2021   Fall at home 05/24/2021   Gait instability 05/24/2021   Hyperglycemia 05/24/2021   Anemia,  unspecified 05/24/2021   GERD (gastroesophageal reflux disease)    Dysphagia 07/12/2017  PCP:  Rosita Fire, MD Pharmacy:   [facility], [place] [address] Phone: [phone] Fax: [phone]  Readmission Risk Interventions No flowsheet data found.

## 2021-05-26 NOTE — NC FL2 (Signed)
[place] LEVEL OF CARE SCREENING TOOL     IDENTIFICATION  Patient Name: Hayden Green Birthdate: Jan 05, 1928 Sex: male Admission Date (Current Location): 05/24/2021  [facility] and [place] Number:  Herbalist and Address:  [facility] [place],  [place], [place]      Provider Number: [id number]  Attending Physician Name and Address:  Bonnielee Haff, MD  Relative Name and Phone Number:  Nickolis Yanik (spouse) Ph: [phone]    Current Level of Care: Hospital Recommended Level of Care: [place] Prior Approval Number:    Date Approved/Denied:   PASRR Number: [id number]  Discharge Plan: SNF    Current Diagnoses: Patient Active Problem List   Diagnosis Date Noted   Closed left hip fracture ([place]) 05/24/2021   Essential hypertension 05/24/2021   History of renal cell carcinoma 05/24/2021   Diabetes ([place]) 05/24/2021   Open-angle glaucoma 05/24/2021   Diabetic retinopathy ([place]) 05/24/2021   COPD (chronic obstructive pulmonary disease) ([place]) 05/24/2021   Fall at home 05/24/2021   Gait instability 05/24/2021   Hyperglycemia 05/24/2021   Anemia, unspecified 05/24/2021   GERD (gastroesophageal reflux disease)    Dysphagia 07/12/2017    Orientation RESPIRATION BLADDER Height & Weight     Self, Time, Situation, Place  Normal Continent Weight: 180 lb 12.4 oz (82 kg) Height:  '5\' 6"'$  (167.6 cm)  BEHAVIORAL SYMPTOMS/MOOD NEUROLOGICAL BOWEL NUTRITION STATUS      Continent Diet (Carb modified)  AMBULATORY STATUS COMMUNICATION OF NEEDS Skin   Extensive Assist Verbally Surgical wounds                       Personal Care Assistance Level of Assistance  Bathing, Feeding, Dressing Bathing Assistance: Limited assistance Feeding assistance: Independent Dressing Assistance: Limited assistance     Functional Limitations Info  Sight, Hearing, Speech Sight Info: Impaired Hearing Info: Adequate Speech Info: Adequate     SPECIAL CARE FACTORS FREQUENCY  PT (By licensed PT), OT (By licensed OT)     PT Frequency: 5x's/week OT Frequency: 5x's/week            Contractures Contractures Info: Not present    Additional Factors Info  Code Status, Allergies, Insulin Sliding Scale Code Status Info: Full Allergies Info: Reglan (Metoclopramide)   Insulin Sliding Scale Info: See discharge summary       Current Medications (05/26/2021):  This is the current hospital active medication list Current Facility-Administered Medications  Medication Dose Route Frequency Provider Last Rate Last Admin   acetaminophen (TYLENOL) tablet 325-650 mg  325-650 mg Oral Q6H PRN Swinteck, Aaron Edelman, MD       albuterol (PROVENTIL) (2.5 MG/3ML) 0.083% nebulizer solution 3 mL  3 mL Nebulization Q6H PRN Swinteck, Aaron Edelman, MD       aspirin EC tablet 325 mg  325 mg Oral Q breakfast Swinteck, Aaron Edelman, MD   325 mg at 05/26/21 0809   bisacodyl (DULCOLAX) EC tablet 5 mg  5 mg Oral Daily PRN Rod Can, MD       brinzolamide (AZOPT) 1 % ophthalmic suspension 1 drop  1 drop Both Eyes TID Rod Can, MD   1 drop at 05/26/21 1021   And   brimonidine (ALPHAGAN) 0.2 % ophthalmic solution 1 drop  1 drop Both Eyes TID Rod Can, MD   1 drop at 05/26/21 [id number]   diphenhydrAMINE (BENADRYL) injection 6.5 mg  6.5 mg Intravenous Once Kathryne Eriksson, NP  docusate sodium (COLACE) capsule 100 mg  100 mg Oral BID Rod Can, MD   100 mg at 05/26/21 0809   feeding supplement (GLUCERNA SHAKE) (GLUCERNA SHAKE) liquid 237 mL  237 mL Oral TID BM Bonnielee Haff, MD   237 mL at 05/26/21 1121   lisinopril (ZESTRIL) tablet 20 mg  20 mg Oral Daily Swinteck, Aaron Edelman, MD   20 mg at 05/24/21 1644   And   hydrochlorothiazide (MICROZIDE) capsule 12.5 mg  12.5 mg Oral Daily Swinteck, Aaron Edelman, MD   12.5 mg at 05/24/21 1643   HYDROcodone-acetaminophen (NORCO) 7.5-325 MG per tablet 1-2 tablet  1-2 tablet Oral Q4H PRN Swinteck, Aaron Edelman, MD        HYDROcodone-acetaminophen (NORCO/VICODIN) 5-325 MG per tablet 1-2 tablet  1-2 tablet Oral Q4H PRN Rod Can, MD   1 tablet at 05/26/21 1232   insulin aspart (novoLOG) injection 0-9 Units  0-9 Units Subcutaneous TID WC Swinteck, Aaron Edelman, MD   2 Units at 05/26/21 1231   insulin aspart (novoLOG) injection 2 Units  2 Units Subcutaneous TID WC Swinteck, Aaron Edelman, MD   2 Units at 05/26/21 1231   ketorolac (ACULAR) 0.5 % ophthalmic solution 1 drop  1 drop Right Eye QID Swinteck, Aaron Edelman, MD   1 drop at 05/26/21 0810   latanoprost (XALATAN) 0.005 % ophthalmic solution 1 drop  1 drop Both Eyes QHS Swinteck, Aaron Edelman, MD   1 drop at 05/25/21 2217   meclizine (ANTIVERT) tablet 25 mg  25 mg Oral QID PRN Swinteck, Aaron Edelman, MD       menthol-cetylpyridinium (CEPACOL) lozenge 3 mg  1 lozenge Oral PRN Swinteck, Aaron Edelman, MD       Or   phenol (CHLORASEPTIC) mouth spray 1 spray  1 spray Mouth/Throat PRN Swinteck, Aaron Edelman, MD       morphine 2 MG/ML injection 0.5-1 mg  0.5-1 mg Intravenous Q2H PRN Swinteck, Aaron Edelman, MD       multivitamin with minerals tablet 1 tablet  1 tablet Oral Daily Bonnielee Haff, MD   1 tablet at 05/26/21 1022   ondansetron (ZOFRAN) tablet 4 mg  4 mg Oral Q6H PRN Swinteck, Aaron Edelman, MD       Or   ondansetron (ZOFRAN) injection 4 mg  4 mg Intravenous Q6H PRN Swinteck, Aaron Edelman, MD       pantoprazole (PROTONIX) EC tablet 40 mg  40 mg Oral Daily Rod Can, MD   40 mg at 05/26/21 [id number]     Discharge Medications: Please see discharge summary for a list of discharge medications.  Relevant Imaging Results:  Relevant Lab Results:   Additional Information SSN: [id number]  Sherie Don, LCSW

## 2021-05-26 NOTE — Evaluation (Signed)
Physical Therapy Evaluation Patient Details Name: Hayden Green MRN: [id number] DOB: 1928-07-29 Today's Date: 05/26/2021   History of Present Illness  87 y.o. male with medical history significant of COPD, essential hypertension, type 2 diabetes mellitus, and history of renal cell carcinoma in remission presented after sustaining a fall while he was on 4 feet ladder to pick apples and found to have fracture of his left femur.  Pt s/p Left femur IM nail 05/25/21.  Clinical Impression  Pt admitted with above diagnosis. Pt currently with functional limitations due to the deficits listed below (see PT Problem List). Pt will benefit from skilled PT to increase their independence and safety with mobility to allow discharge to the venue listed below.   Pt requiring increased cues and assist to stand from [facility] and return to bed.  Pt unable to weight bear well through any extremities despite multimodal cues.  Pt currently total assist +2 for mobility and would benefit from d/c to SNF.     Follow Up Recommendations SNF    Equipment Recommendations  Wheelchair (measurements PT);Wheelchair cushion (measurements PT);Hospital bed    Recommendations for Other Services       Precautions / Restrictions Precautions Precautions: Fall Restrictions Weight Bearing Restrictions: No Other Position/Activity Restrictions: WBAT      Mobility  Bed Mobility Overal bed mobility: Needs Assistance Bed Mobility: Sit to Supine       Sit to supine: Max assist;+2 for physical assistance   General bed mobility comments: pt fatigued with attempts to stand from [facility], assist for weakness    Transfers Overall transfer level: Needs assistance Equipment used: Rolling walker (2 wheeled) Transfers: Sit to/from Stand Sit to Stand: Max assist;+2 physical assistance;Total assist         General transfer comment: multimodal cues provided, pt on BSC on arrival and unable to assist pt with only 1 person, required +2, pt  with difficulty placing weight through all extremities, and unable to stand fully erect despite cues; pt able to return to bed by removing BSC and pulling up bed behind pt  Ambulation/Gait                Stairs            Wheelchair Mobility    Modified Rankin (Stroke Patients Only)       Balance Overall balance assessment: Needs assistance         Standing balance support: Bilateral upper extremity supported Standing balance-Leahy Scale: Zero                               Pertinent Vitals/Pain Pain Assessment: 0-10 Pain Score: 3  Pain Location: left hip Pain Descriptors / Indicators: Sore;Aching Pain Intervention(s): Repositioned;Monitored during session;Patient requesting pain meds-RN notified    Home Living Family/patient expects to be discharged to:: Private residence Living Arrangements: Spouse/significant other   Type of Home: House Home Access: Level entry     Home Layout: Able to live on main level with bedroom/bathroom Home Equipment: [place] - single point;Walker - 2 wheels      Prior Function Level of Independence: Independent with assistive device(s)         Comments: uses cane     Hand Dominance        Extremity/Trunk Assessment        Lower Extremity Assessment Lower Extremity Assessment: Generalized weakness;LLE deficits/detail LLE Deficits / Details: pt limiting assisted movement due to pain  LLE: Unable to fully assess due to pain       Communication   Communication: No difficulties  Cognition Arousal/Alertness: Awake/alert Behavior During Therapy: WFL for tasks assessed/performed Overall Cognitive Status: Within Functional Limits for tasks assessed                                        General Comments      Exercises     Assessment/Plan    PT Assessment Patient needs continued PT services  PT Problem List Decreased strength;Decreased mobility;Decreased activity  tolerance;Decreased balance;Decreased knowledge of use of DME;Pain;Decreased coordination       PT Treatment Interventions Gait training;DME instruction;Therapeutic exercise;Balance training;Functional mobility training;Therapeutic activities;Patient/family education    PT Goals (Current goals can be found in the Care Plan section)  Acute Rehab PT Goals PT Goal Formulation: With patient Time For Goal Achievement: 06/09/21 Potential to Achieve Goals: Fair    Frequency Min 3X/week   Barriers to discharge        Co-evaluation               AM-PAC PT "6 Clicks" Mobility  Outcome Measure Help needed turning from your back to your side while in a flat bed without using bedrails?: Total Help needed moving from lying on your back to sitting on the side of a flat bed without using bedrails?: Total Help needed moving to and from a bed to a chair (including a wheelchair)?: Total Help needed standing up from a chair using your arms (e.g., wheelchair or bedside chair)?: Total Help needed to walk in hospital room?: Total Help needed climbing 3-5 steps with a railing? : Total 6 Click Score: 6    End of Session Equipment Utilized During Treatment: Gait belt Activity Tolerance: Patient tolerated treatment well Patient left: in bed;with call bell/phone within reach;with bed alarm set;with family/visitor present Nurse Communication: Mobility status PT Visit Diagnosis: Other abnormalities of gait and mobility (R26.89)    Time: [id number] PT Time Calculation (min) (ACUTE ONLY): 25 min   Charges:   PT Evaluation $PT Eval Low Complexity: 1 Low PT Treatments $Therapeutic Activity: 8-22 mins      Jannette Spanner PT, DPT Acute Rehabilitation Services Pager: [phone] Office: [phone]   York Ram E 05/26/2021, 11:53 AM

## 2021-05-26 NOTE — Progress Notes (Signed)
    Subjective:  Patient reports pain as mild to moderate.  Denies N/V/CP/SOB.   Objective:   VITALS:   Vitals:   05/26/21 0100 05/26/21 0532 05/26/21 1024 05/26/21 1356  BP: (!) 105/53 118/70 (!) 111/56 (!) 121/58  Pulse: 73 72 76 84  Resp: '16 18 18 18  '$ Temp: 98.7 F (37.1 C) 98 F (36.7 C) 98.8 F (37.1 C) 99 F (37.2 C)  TempSrc: Oral Oral Oral   SpO2: 100% 100% 92% 93%  Weight:      Height:        NAD ABD soft Neurovascular intact Sensation intact distally Intact pulses distally Dorsiflexion/Plantar flexion intact Incision: dressing C/D/I   Lab Results  Component Value Date   WBC 11.4 (H) 05/26/2021   HGB 9.1 (L) 05/26/2021   HCT 29.4 (L) 05/26/2021   MCV 91.9 05/26/2021   PLT 115 (L) 05/26/2021   BMET    Component Value Date/Time   NA 137 05/26/2021 0321   K 3.8 05/26/2021 0321   CL 109 05/26/2021 0321   CO2 25 05/26/2021 0321   GLUCOSE 194 (H) 05/26/2021 0321   BUN 16 05/26/2021 0321   CREATININE 1.09 05/26/2021 0321   CALCIUM 8.7 (L) 05/26/2021 0321   GFRNONAA >60 05/26/2021 0321   GFRAA 53 (L) 07/02/2020 1014     Assessment/Plan: 1 Day Post-Op   Principal Problem:   Closed left hip fracture (HCC) Active Problems:   Dysphagia   Essential hypertension   History of renal cell carcinoma   Diabetes (HCC)   Open-angle glaucoma   Diabetic retinopathy (HCC)   COPD (chronic obstructive pulmonary disease) (HCC)   Fall at home   Gait instability   Hyperglycemia   Anemia, unspecified   GERD (gastroesophageal reflux disease)   WBAT with walker DVT ppx: Aspirin, SCDs, TEDS PO pain control PT/OT ABLA: HgB 9.1, treat per hospitalist recommendations Dispo: Pending. Follow up with Dr.Swinteck in 2 weeks for staple removal and radiographs     Dorothyann Peng 05/26/2021, 2:10 PM  [place] is now [facility] [address] [place], [place], [place] [address] Phone:  [phone] [email or web address] Facebook  [employer]

## 2021-05-26 NOTE — Plan of Care (Signed)
Plan of care reviewed and discussed with the patient. 

## 2021-05-27 DIAGNOSIS — D649 Anemia, unspecified: Secondary | ICD-10-CM | POA: Diagnosis not present

## 2021-05-27 DIAGNOSIS — E119 Type 2 diabetes mellitus without complications: Secondary | ICD-10-CM | POA: Diagnosis not present

## 2021-05-27 DIAGNOSIS — S72002A Fracture of unspecified part of neck of left femur, initial encounter for closed fracture: Secondary | ICD-10-CM | POA: Diagnosis not present

## 2021-05-27 DIAGNOSIS — I10 Essential (primary) hypertension: Secondary | ICD-10-CM | POA: Diagnosis not present

## 2021-05-27 LAB — BASIC METABOLIC PANEL
Anion gap: 8 (ref 5–15)
BUN: 14 mg/dL (ref 8–23)
CO2: 23 mmol/L (ref 22–32)
Calcium: 9 mg/dL (ref 8.9–10.3)
Chloride: 106 mmol/L (ref 98–111)
Creatinine, Ser: 1 mg/dL (ref 0.61–1.24)
GFR, Estimated: >60 mL/min (ref >60)
Glucose, Bld: 148 mg/dL — ABNORMAL HIGH (ref 70–99)
Potassium: 3.3 mmol/L — ABNORMAL LOW (ref 3.5–5.1)
Sodium: 137 mmol/L (ref 135–145)

## 2021-05-27 LAB — GLUCOSE, CAPILLARY
Glucose-Capillary: 130 mg/dL — ABNORMAL HIGH (ref 70–99)
Glucose-Capillary: 115 mg/dL — ABNORMAL HIGH (ref 70–99)
Glucose-Capillary: 170 mg/dL — ABNORMAL HIGH (ref 70–99)
Glucose-Capillary: 137 mg/dL — ABNORMAL HIGH (ref 70–99)
Glucose-Capillary: 126 mg/dL — ABNORMAL HIGH (ref 70–99)

## 2021-05-27 LAB — CBC
HCT: 30.4 % — ABNORMAL LOW (ref 39.0–52.0)
Hemoglobin: 9.4 g/dL — ABNORMAL LOW (ref 13.0–17.0)
MCH: 28.1 pg (ref 26.0–34.0)
MCHC: 30.9 g/dL (ref 30.0–36.0)
MCV: 91 fL (ref 80.0–100.0)
Platelets: 116 10*3/uL — ABNORMAL LOW (ref 150–400)
RBC: 3.34 MIL/uL — ABNORMAL LOW (ref 4.22–5.81)
RDW: 12.4 % (ref 11.5–15.5)
WBC: 10.9 10*3/uL — ABNORMAL HIGH (ref 4.0–10.5)
nRBC: 0 % (ref 0.0–0.2)

## 2021-05-27 MED ORDER — POLYETHYLENE GLYCOL 3350 17 G PO PACK
17.0000 g | PACK | Freq: Every day | ORAL | Status: DC
  Administered 2021-05-27 – 2021-05-28 (×2): 17 g via ORAL
  Filled 2021-05-27 (×2): qty 1

## 2021-05-27 MED ORDER — POTASSIUM CHLORIDE CRYS ER 20 MEQ PO TBCR
40.0000 meq | EXTENDED_RELEASE_TABLET | Freq: Once | ORAL | Status: AC
  Administered 2021-05-27: 40 meq via ORAL
  Filled 2021-05-27: qty 2

## 2021-05-27 NOTE — Plan of Care (Signed)
  Problem: Pain Management: Goal: Pain level will decrease with appropriate interventions Outcome: Progressing   

## 2021-05-27 NOTE — Progress Notes (Signed)
[facility] PROGRESS NOTE   Hayden Green [id number] DOB: 1928-01-20 DOA: 05/24/2021  PCP: Rosita Fire, MD  Brief History/Interval Summary: 87 y.o. male with medical history significant of COPD, essential hypertension, type 2 diabetes mellitus, and history of renal cell carcinoma in remission presented after sustaining a fall while he was on 4 feet ladder to pick apples.  Taken to the emergency department at [facility] where he was found to have fracture of his left femur.  Subsequently transferred over to [facility] for further management.     Consultants: Orthopedics  Procedures: Intramedullary fixation, Left femur. 7/21  Antibiotics: Anti-infectives (From admission, onward)    Start     Dose/Rate Route Frequency Ordered Stop   05/25/21 1600  ceFAZolin (ANCEF) 2 g in sodium chloride 0.9 % 100 mL IVPB        2 g 200 mL/hr over 30 Minutes Intravenous Every 6 hours 05/25/21 1302 05/25/21 2248   05/25/21 0930  ceFAZolin (ANCEF) 2 g in sodium chloride 0.9 % 100 mL IVPB        2 g 200 mL/hr over 30 Minutes Intravenous On call to O.R. 05/24/21 1921 05/25/21 1036   05/25/21 0600  ceFAZolin (ANCEF) 2 g in sodium chloride 0.9 % 100 mL IVPB  Status:  Discontinued        2 g 200 mL/hr over 30 Minutes Intravenous On call to O.R. 05/24/21 2311 05/24/21 2319       Subjective/Interval History: Patient denies any significant pain or discomfort.  He is mainly concerned that he has been constipated for the last several days.  Denies any abdominal pain nausea or vomiting.    Assessment/Plan:  Left intertrochanteric fracture Secondary to mechanical fall.  Seen by orthopedics and underwent surgery on 7/21.  Seen by PT and skilled nursing facility is recommended for rehab.  Transition of care is following.  Constipation Abdomen is benign.  He is constipated likely due to lack of mobility, pain issues and medications.  Introduce laxatives.  Diabetes mellitus type 2 with diabetic  retinopathy Holding metformin.  CBGs are reasonably well controlled.  Continue SSI.  HbA1c 5.6  History of glaucoma and diabetic retinopathy Continue with eyedrops.  Essential hypertension Continue with home medication regimen including lisinopril HCTZ.  Blood pressure remains reasonably well controlled.  Will supplement potassium.    History of COPD Seems to be stable from a respiratory standpoint.  History of renal cell carcinoma in remission Stable  History of GERD Continue PPI  Normocytic anemia/postoperative anemia due to blood loss Drop in hemoglobin likely multifactorial including blood loss from surgery and fracture.  Hemoglobin is stable.  Transfuse if it drops below 7.    Mild hyponatremia Resolved.    DVT Prophylaxis: Orthopedics has initiated aspirin for DVT prophylaxis. Code Status: Full code Family Communication: Discussed with patient.  No family at bedside Disposition Plan: Needs to go to SNF for rehab.  Status is: Inpatient  Remains inpatient appropriate because:Ongoing active pain requiring inpatient pain management and Inpatient level of care appropriate due to severity of illness  Dispo: The patient is from: Home              Anticipated d/c is to: SNF              Patient currently is not medically stable to d/c.   Difficult to place patient No        Medications: Scheduled:  aspirin EC  325 mg Oral Q breakfast  brinzolamide  1 drop Both Eyes TID   And   brimonidine  1 drop Both Eyes TID   diphenhydrAMINE  6.5 mg Intravenous Once   docusate sodium  100 mg Oral BID   feeding supplement (GLUCERNA SHAKE)  237 mL Oral TID BM   lisinopril  20 mg Oral Daily   And   hydrochlorothiazide  12.5 mg Oral Daily   insulin aspart  0-9 Units Subcutaneous TID WC   insulin aspart  2 Units Subcutaneous TID WC   latanoprost  1 drop Both Eyes QHS   multivitamin with minerals  1 tablet Oral Daily   pantoprazole  40 mg Oral Daily   polyethylene glycol  17 g  Oral Daily   Continuous:   [id number], albuterol, bisacodyl, HYDROcodone-acetaminophen, meclizine, menthol-cetylpyridinium **OR** phenol, methocarbamol, morphine injection, ondansetron **OR** ondansetron (ZOFRAN) IV   Objective:  Vital Signs  Vitals:   05/26/21 1024 05/26/21 1356 05/26/21 2113 05/27/21 0510  BP: (!) 111/56 (!) 121/58 (!) 134/54 (!) 137/58  Pulse: 76 84 72 75  Resp: '18 18 16 18  '$ Temp: 98.8 F (37.1 C) 99 F (37.2 C) 98.7 F (37.1 C) 98.4 F (36.9 C)  TempSrc: Oral  Oral Oral  SpO2: 92% 93% 100% 95%  Weight:      Height:        Intake/Output Summary (Last 24 hours) at 05/27/2021 1015 Last data filed at 05/27/2021 0030 Gross per 24 hour  Intake 480 ml  Output 850 ml  Net -370 ml    Filed Weights   05/24/21 1039  Weight: 82 kg    General appearance: Awake alert.  In no distress Resp: Clear to auscultation bilaterally.  Normal effort Cardio: S1-S2 is normal regular.  No S3-S4.  No rubs murmurs or bruit GI: Abdomen is soft.  Nontender nondistended.  Bowel sounds are present normal.  No masses organomegaly Extremities: No significant bruising noted over the left hip area.  Some swelling is present. Neurologic: No focal neurological deficits.      Lab Results:  Data Reviewed: I have personally reviewed following labs and imaging studies  CBC: Recent Labs  Lab 05/24/21 1245 05/25/21 0323 05/26/21 0321 05/27/21 0231  WBC 8.8 9.0 11.4* 10.9*  NEUTROABS 7.0  --   --   --   HGB 11.3* 9.8* 9.1* 9.4*  HCT 36.2* 32.0* 29.4* 30.4*  MCV 91.0 91.2 91.9 91.0  PLT 141* 132* 115* 116*     Basic Metabolic Panel: Recent Labs  Lab 05/24/21 1245 05/25/21 0323 05/26/21 0321 05/27/21 0231  NA 134* 136 137 137  K 3.6 3.7 3.8 3.3*  CL 101 107 109 106  CO2 '26 23 25 23  '$ GLUCOSE 149* 142* 194* 148*  BUN '17 15 16 14  '$ CREATININE 1.08 1.05 1.09 1.00  CALCIUM 9.6 9.1 8.7* 9.0  MG  --  1.7  --   --      GFR: Estimated Creatinine Clearance:  48.4 mL/min (by C-G formula based on SCr of 1 mg/dL).  Liver Function Tests: Recent Labs  Lab 05/24/21 1245 05/25/21 0323  AST 22 21  ALT 11 10  ALKPHOS 67 54  BILITOT 0.8 0.9  PROT 6.5 5.8*  ALBUMIN 4.0 3.5      HbA1C: Recent Labs    05/24/21 1247  HGBA1C 5.6     CBG: Recent Labs  Lab 05/26/21 1200 05/26/21 1628 05/26/21 2114 05/27/21 0240 05/27/21 0726  GLUCAP 166* 163* 155* 130* 115*  Recent Results (from the past 240 hour(s))  Resp Panel by RT-PCR (Flu A&B, Covid) Nasopharyngeal Swab     Status: None   Collection Time: 05/24/21 12:34 PM   Specimen: Nasopharyngeal Swab; Nasopharyngeal(NP) swabs in vial transport medium  Result Value Ref Range Status   SARS Coronavirus 2 by RT PCR NEGATIVE NEGATIVE Final    Comment: (NOTE) SARS-CoV-2 target nucleic acids are NOT DETECTED.  The SARS-CoV-2 RNA is generally detectable in upper respiratory specimens during the acute phase of infection. The lowest concentration of SARS-CoV-2 viral copies this assay can detect is 138 copies/mL. A negative result does not preclude SARS-Cov-2 infection and should not be used as the sole basis for treatment or other patient management decisions. A negative result may occur with  improper specimen collection/handling, submission of specimen other than nasopharyngeal swab, presence of viral mutation(s) within the areas targeted by this assay, and inadequate number of viral copies(<138 copies/mL). A negative result must be combined with clinical observations, patient history, and epidemiological information. The expected result is Negative.  Fact Sheet for Patients:  EntrepreneurPulse.com.au  Fact Sheet for Healthcare Providers:  IncredibleEmployment.be  This test is no t yet approved or cleared by the [place] FDA and  has been authorized for detection and/or diagnosis of SARS-CoV-2 by FDA under an Emergency Use Authorization  (EUA). This EUA will remain  in effect (meaning this test can be used) for the duration of the COVID-19 declaration under Section 564(b)(1) of the Act, 21 U.S.C.section 360bbb-3(b)(1), unless the authorization is terminated  or revoked sooner.       Influenza A by PCR NEGATIVE NEGATIVE Final   Influenza B by PCR NEGATIVE NEGATIVE Final    Comment: (NOTE) The Xpert Xpress SARS-CoV-2/FLU/RSV plus assay is intended as an aid in the diagnosis of influenza from Nasopharyngeal swab specimens and should not be used as a sole basis for treatment. Nasal washings and aspirates are unacceptable for Xpert Xpress SARS-CoV-2/FLU/RSV testing.  Fact Sheet for Patients: EntrepreneurPulse.com.au  Fact Sheet for Healthcare Providers: IncredibleEmployment.be  This test is not yet approved or cleared by the [place] FDA and has been authorized for detection and/or diagnosis of SARS-CoV-2 by FDA under an Emergency Use Authorization (EUA). This EUA will remain in effect (meaning this test can be used) for the duration of the COVID-19 declaration under Section 564(b)(1) of the Act, 21 U.S.C. section 360bbb-3(b)(1), unless the authorization is terminated or revoked.  Performed at [facility], [address], [place], [place] [address]   Surgical PCR screen     Status: None   Collection Time: 05/24/21  6:44 PM   Specimen: Nasal Mucosa; Nasal Swab  Result Value Ref Range Status   MRSA, PCR NEGATIVE NEGATIVE Final   Staphylococcus aureus NEGATIVE NEGATIVE Final    Comment: (NOTE) The Xpert SA Assay (FDA approved for NASAL specimens in patients 79 years of age and older), is one component of a comprehensive surveillance program. It is not intended to diagnose infection nor to guide or monitor treatment. Performed at [facility], [place] [address] [place], [place] [address]        Radiology Studies: Pelvis Portable  Result Date:  05/25/2021 CLINICAL DATA:  Status post left hip IM nail. EXAM: PORTABLE PELVIS 1-2 VIEWS COMPARISON:  Preoperative radiograph yesterday FINDINGS: Intramedullary nail with trans trochanteric and distal screw fixation of intertrochanteric femur fracture. No periprosthetic lucency. Recent postsurgical change includes air and edema in the soft tissues as well as skin staples laterally.  IMPRESSION: ORIF of intertrochanteric femur fracture without immediate postoperative complication. Electronically Signed   By: Keith Rake M.D.   On: 05/25/2021 14:40   DG C-Arm 1-60 Min-No Report  Result Date: 05/25/2021 Fluoroscopy was utilized by the requesting physician.  No radiographic interpretation.   DG HIP OPERATIVE UNILAT W OR W/O PELVIS LEFT  Result Date: 05/25/2021 CLINICAL DATA:  Internal fixation of left femur fracture. EXAM: OPERATIVE LEFT HIP (WITH PELVIS IF PERFORMED) 2 VIEWS TECHNIQUE: Fluoroscopic spot image(s) were submitted for interpretation post-operatively. COMPARISON:  05/24/2021 FINDINGS: Internal fixation of left intertrochanteric hip fracture with a dynamic hip screw. Short intramedullary nail in the proximal left femur with a distal interlocking screw. Left hip is located on these images. IMPRESSION: Internal fixation of left intertrochanteric femur fracture. Electronically Signed   By: Markus Daft M.D.   On: 05/25/2021 12:22       LOS: 3 days   [facility] Pager on [email or web address]  05/27/2021, 10:15 AM

## 2021-05-28 DIAGNOSIS — I10 Essential (primary) hypertension: Secondary | ICD-10-CM | POA: Diagnosis not present

## 2021-05-28 DIAGNOSIS — D649 Anemia, unspecified: Secondary | ICD-10-CM | POA: Diagnosis not present

## 2021-05-28 DIAGNOSIS — K5900 Constipation, unspecified: Secondary | ICD-10-CM

## 2021-05-28 DIAGNOSIS — E119 Type 2 diabetes mellitus without complications: Secondary | ICD-10-CM | POA: Diagnosis not present

## 2021-05-28 DIAGNOSIS — S72002A Fracture of unspecified part of neck of left femur, initial encounter for closed fracture: Secondary | ICD-10-CM | POA: Diagnosis not present

## 2021-05-28 LAB — CBC
HCT: 30.1 % — ABNORMAL LOW (ref 39.0–52.0)
Hemoglobin: 9.2 g/dL — ABNORMAL LOW (ref 13.0–17.0)
MCH: 27.7 pg (ref 26.0–34.0)
MCHC: 30.6 g/dL (ref 30.0–36.0)
MCV: 90.7 fL (ref 80.0–100.0)
Platelets: 123 10*3/uL — ABNORMAL LOW (ref 150–400)
RBC: 3.32 MIL/uL — ABNORMAL LOW (ref 4.22–5.81)
RDW: 12.3 % (ref 11.5–15.5)
WBC: 9.6 10*3/uL (ref 4.0–10.5)
nRBC: 0 % (ref 0.0–0.2)

## 2021-05-28 LAB — MAGNESIUM: Magnesium: 2.1 mg/dL (ref 1.7–2.4)

## 2021-05-28 LAB — GLUCOSE, CAPILLARY
Glucose-Capillary: 144 mg/dL — ABNORMAL HIGH (ref 70–99)
Glucose-Capillary: 126 mg/dL — ABNORMAL HIGH (ref 70–99)
Glucose-Capillary: 156 mg/dL — ABNORMAL HIGH (ref 70–99)
Glucose-Capillary: 155 mg/dL — ABNORMAL HIGH (ref 70–99)
Glucose-Capillary: 162 mg/dL — ABNORMAL HIGH (ref 70–99)

## 2021-05-28 LAB — BASIC METABOLIC PANEL
Anion gap: 7 (ref 5–15)
BUN: 16 mg/dL (ref 8–23)
CO2: 27 mmol/L (ref 22–32)
Calcium: 9.4 mg/dL (ref 8.9–10.3)
Chloride: 105 mmol/L (ref 98–111)
Creatinine, Ser: 1.03 mg/dL (ref 0.61–1.24)
GFR, Estimated: >60 mL/min (ref >60)
Glucose, Bld: 138 mg/dL — ABNORMAL HIGH (ref 70–99)
Potassium: 3.7 mmol/L (ref 3.5–5.1)
Sodium: 139 mmol/L (ref 135–145)

## 2021-05-28 MED ORDER — SENNOSIDES-DOCUSATE SODIUM 8.6-50 MG PO TABS
2.0000 | ORAL_TABLET | Freq: Two times a day (BID) | ORAL | Status: DC
  Administered 2021-05-28 – 2021-05-29 (×3): 2 via ORAL
  Filled 2021-05-28 (×3): qty 2

## 2021-05-28 MED ORDER — POLYETHYLENE GLYCOL 3350 17 G PO PACK
17.0000 g | PACK | Freq: Two times a day (BID) | ORAL | Status: DC
  Administered 2021-05-28 – 2021-05-29 (×2): 17 g via ORAL
  Filled 2021-05-28 (×3): qty 1

## 2021-05-28 MED ORDER — FLEET ENEMA 7-19 GM/118ML RE ENEM: 1.0000 | ENEMA | Freq: Every day | RECTAL | Status: DC | PRN

## 2021-05-28 NOTE — Progress Notes (Signed)
[facility] PROGRESS NOTE   Hayden Green [id number] DOB: 1928/06/01 DOA: 05/24/2021  PCP: Rosita Fire, MD  Brief History/Interval Summary: 87 y.o. male with medical history significant of COPD, essential hypertension, type 2 diabetes mellitus, and history of renal cell carcinoma in remission presented after sustaining a fall while he was on 4 feet ladder to pick apples.  Taken to the emergency department at [facility] where he was found to have fracture of his left femur.  Subsequently transferred over to [facility] for further management.     Consultants: Orthopedics  Procedures: Intramedullary fixation, Left femur. 7/21  Antibiotics: Anti-infectives (From admission, onward)    Start     Dose/Rate Route Frequency Ordered Stop   05/25/21 1600  ceFAZolin (ANCEF) 2 g in sodium chloride 0.9 % 100 mL IVPB        2 g 200 mL/hr over 30 Minutes Intravenous Every 6 hours 05/25/21 1302 05/25/21 2248   05/25/21 0930  ceFAZolin (ANCEF) 2 g in sodium chloride 0.9 % 100 mL IVPB        2 g 200 mL/hr over 30 Minutes Intravenous On call to O.R. 05/24/21 1921 05/25/21 1036   05/25/21 0600  ceFAZolin (ANCEF) 2 g in sodium chloride 0.9 % 100 mL IVPB  Status:  Discontinued        2 g 200 mL/hr over 30 Minutes Intravenous On call to O.R. 05/24/21 2311 05/24/21 2319       Subjective/Interval History: Patient denies any pain or discomfort over the left hip area.  His main concern is that he still has not had a bowel movement.     Assessment/Plan:  Left intertrochanteric fracture Secondary to mechanical fall.  Seen by orthopedics and underwent surgery on 7/21.  Seen by PT and skilled nursing facility is recommended for rehab.  Transition of care is following.  Constipation Patient remains constipated.  Abdomen remains benign.  He is constipated likely due to lack of mobility, pain issues and medications.  Started on laxatives yesterday.  Will increase the dose today.  May need to add  additional agents.  May need to give suppository or enemas.  Normocytic anemia/postoperative anemia due to blood loss Drop in hemoglobin likely multifactorial including blood loss from surgery and fracture.  Hemoglobin is stable.  Transfuse if it drops below 7.    Diabetes mellitus type 2 with diabetic retinopathy Holding metformin.  CBGs are reasonably well controlled.  Continue SSI.  HbA1c 5.6  History of glaucoma and diabetic retinopathy Continue with eyedrops.  Essential hypertension/hypokalemia Continue with home medication regimen including lisinopril HCTZ.  Blood pressure remains reasonably well controlled.  Potassium is better.  Will give additional dose.  History of COPD Seems to be stable from a respiratory standpoint.  History of renal cell carcinoma in remission Stable  History of GERD Continue PPI  Mild hyponatremia Resolved.    DVT Prophylaxis: Orthopedics has initiated aspirin for DVT prophylaxis. Code Status: Full code Family Communication: Discussed with patient.  We will call his wife today. Disposition Plan: Needs to go to SNF for rehab.  Status is: Inpatient  Remains inpatient appropriate because:Ongoing active pain requiring inpatient pain management and Inpatient level of care appropriate due to severity of illness  Dispo: The patient is from: Home              Anticipated d/c is to: SNF              Patient currently is not medically stable  to d/c.   Difficult to place patient No        Medications: Scheduled:  aspirin EC  325 mg Oral Q breakfast   brinzolamide  1 drop Both Eyes TID   And   brimonidine  1 drop Both Eyes TID   diphenhydrAMINE  6.5 mg Intravenous Once   docusate sodium  100 mg Oral BID   feeding supplement (GLUCERNA SHAKE)  237 mL Oral TID BM   lisinopril  20 mg Oral Daily   And   hydrochlorothiazide  12.5 mg Oral Daily   insulin aspart  0-9 Units Subcutaneous TID WC   insulin aspart  2 Units Subcutaneous TID WC    latanoprost  1 drop Both Eyes QHS   multivitamin with minerals  1 tablet Oral Daily   pantoprazole  40 mg Oral Daily   polyethylene glycol  17 g Oral Daily   Continuous:   [id number], albuterol, bisacodyl, HYDROcodone-acetaminophen, meclizine, menthol-cetylpyridinium **OR** phenol, methocarbamol, morphine injection, ondansetron **OR** ondansetron (ZOFRAN) IV   Objective:  Vital Signs  Vitals:   05/27/21 0510 05/27/21 1407 05/27/21 2231 05/28/21 0618  BP: (!) 137/58 (!) 108/55 (!) 113/58 139/64  Pulse: 75 72 71 74  Resp: '18 18 17 17  '$ Temp: 98.4 F (36.9 C) 98.4 F (36.9 C) 99.1 F (37.3 C) 99.2 F (37.3 C)  TempSrc: Oral Oral Oral Oral  SpO2: 95% 95% 96% 97%  Weight:      Height:        Intake/Output Summary (Last 24 hours) at 05/28/2021 0920 Last data filed at 05/28/2021 0618 Gross per 24 hour  Intake 840 ml  Output 1400 ml  Net -560 ml    Filed Weights   05/24/21 1039  Weight: 82 kg    General appearance: Awake alert.  In no distress Resp: Clear to auscultation bilaterally.  Normal effort Cardio: S1-S2 is normal regular.  No S3-S4.  No rubs murmurs or bruit GI: Abdomen is soft.  Nontender nondistended.  Bowel sounds are present normal.  No masses organomegaly Extremities: No bruising noted over the left hip area.  Able to move all of his extremities. Neurologic:  No focal neurological deficits.     Lab Results:  Data Reviewed: I have personally reviewed following labs and imaging studies  CBC: Recent Labs  Lab 05/24/21 1245 05/25/21 0323 05/26/21 0321 05/27/21 0231 05/28/21 0326  WBC 8.8 9.0 11.4* 10.9* 9.6  NEUTROABS 7.0  --   --   --   --   HGB 11.3* 9.8* 9.1* 9.4* 9.2*  HCT 36.2* 32.0* 29.4* 30.4* 30.1*  MCV 91.0 91.2 91.9 91.0 90.7  PLT 141* 132* 115* 116* 123*     Basic Metabolic Panel: Recent Labs  Lab 05/24/21 1245 05/25/21 0323 05/26/21 0321 05/27/21 0231 05/28/21 0326  NA 134* 136 137 137 139  K 3.6 3.7 3.8 3.3* 3.7   CL 101 107 109 106 105  CO2 '26 23 25 23 27  '$ GLUCOSE 149* 142* 194* 148* 138*  BUN '17 15 16 14 16  '$ CREATININE 1.08 1.05 1.09 1.00 1.03  CALCIUM 9.6 9.1 8.7* 9.0 9.4  MG  --  1.7  --   --  2.1     GFR: Estimated Creatinine Clearance: 47 mL/min (by C-G formula based on SCr of 1.03 mg/dL).  Liver Function Tests: Recent Labs  Lab 05/24/21 1245 05/25/21 0323  AST 22 21  ALT 11 10  ALKPHOS 67 54  BILITOT 0.8 0.9  PROT 6.5 5.8*  ALBUMIN 4.0 3.5      HbA1C: No results for input(s): HGBA1C in the last 72 hours.   CBG: Recent Labs  Lab 05/27/21 1119 05/27/21 1638 05/27/21 2224 05/28/21 0248 05/28/21 0718  GLUCAP 170* 137* 126* 144* 126*      Recent Results (from the past 240 hour(s))  Resp Panel by RT-PCR (Flu A&B, Covid) Nasopharyngeal Swab     Status: None   Collection Time: 05/24/21 12:34 PM   Specimen: Nasopharyngeal Swab; Nasopharyngeal(NP) swabs in vial transport medium  Result Value Ref Range Status   SARS Coronavirus 2 by RT PCR NEGATIVE NEGATIVE Final    Comment: (NOTE) SARS-CoV-2 target nucleic acids are NOT DETECTED.  The SARS-CoV-2 RNA is generally detectable in upper respiratory specimens during the acute phase of infection. The lowest concentration of SARS-CoV-2 viral copies this assay can detect is 138 copies/mL. A negative result does not preclude SARS-Cov-2 infection and should not be used as the sole basis for treatment or other patient management decisions. A negative result may occur with  improper specimen collection/handling, submission of specimen other than nasopharyngeal swab, presence of viral mutation(s) within the areas targeted by this assay, and inadequate number of viral copies(<138 copies/mL). A negative result must be combined with clinical observations, patient history, and epidemiological information. The expected result is Negative.  Fact Sheet for Patients:  EntrepreneurPulse.com.au  Fact Sheet for  Healthcare Providers:  IncredibleEmployment.be  This test is no t yet approved or cleared by the [place] FDA and  has been authorized for detection and/or diagnosis of SARS-CoV-2 by FDA under an Emergency Use Authorization (EUA). This EUA will remain  in effect (meaning this test can be used) for the duration of the COVID-19 declaration under Section 564(b)(1) of the Act, 21 U.S.C.section 360bbb-3(b)(1), unless the authorization is terminated  or revoked sooner.       Influenza A by PCR NEGATIVE NEGATIVE Final   Influenza B by PCR NEGATIVE NEGATIVE Final    Comment: (NOTE) The Xpert Xpress SARS-CoV-2/FLU/RSV plus assay is intended as an aid in the diagnosis of influenza from Nasopharyngeal swab specimens and should not be used as a sole basis for treatment. Nasal washings and aspirates are unacceptable for Xpert Xpress SARS-CoV-2/FLU/RSV testing.  Fact Sheet for Patients: EntrepreneurPulse.com.au  Fact Sheet for Healthcare Providers: IncredibleEmployment.be  This test is not yet approved or cleared by the [place] FDA and has been authorized for detection and/or diagnosis of SARS-CoV-2 by FDA under an Emergency Use Authorization (EUA). This EUA will remain in effect (meaning this test can be used) for the duration of the COVID-19 declaration under Section 564(b)(1) of the Act, 21 U.S.C. section 360bbb-3(b)(1), unless the authorization is terminated or revoked.  Performed at [facility], [address], [place], [place] [address]   Surgical PCR screen     Status: None   Collection Time: 05/24/21  6:44 PM   Specimen: Nasal Mucosa; Nasal Swab  Result Value Ref Range Status   MRSA, PCR NEGATIVE NEGATIVE Final   Staphylococcus aureus NEGATIVE NEGATIVE Final    Comment: (NOTE) The Xpert SA Assay (FDA approved for NASAL specimens in patients 89 years of age and older), is one component of a  comprehensive surveillance program. It is not intended to diagnose infection nor to guide or monitor treatment. Performed at [facility], [place] [address], [place], [place] [address]        Radiology Studies: No results found.     LOS: 4 days  Barnie Sopko [employer]  [facility] operational officer on [email or web address]  05/28/2021, 9:20 AM

## 2021-05-28 NOTE — Plan of Care (Signed)
  Problem: Education: Goal: Knowledge of the prescribed therapeutic regimen will improve Outcome: Progressing   Problem: Pain Management: Goal: Pain level will decrease with appropriate interventions Outcome: Progressing   Problem: Coping: Goal: Level of anxiety will decrease Outcome: Progressing

## 2021-05-28 NOTE — Plan of Care (Signed)
Plan of care reviewed and discussed. °

## 2021-05-29 DIAGNOSIS — Z743 Need for continuous supervision: Secondary | ICD-10-CM | POA: Diagnosis not present

## 2021-05-29 DIAGNOSIS — S72002A Fracture of unspecified part of neck of left femur, initial encounter for closed fracture: Secondary | ICD-10-CM | POA: Diagnosis not present

## 2021-05-29 DIAGNOSIS — R531 Weakness: Secondary | ICD-10-CM | POA: Diagnosis not present

## 2021-05-29 DIAGNOSIS — M6281 Muscle weakness (generalized): Secondary | ICD-10-CM | POA: Diagnosis not present

## 2021-05-29 DIAGNOSIS — Z9181 History of falling: Secondary | ICD-10-CM | POA: Diagnosis not present

## 2021-05-29 DIAGNOSIS — D62 Acute posthemorrhagic anemia: Secondary | ICD-10-CM | POA: Diagnosis not present

## 2021-05-29 DIAGNOSIS — R2689 Other abnormalities of gait and mobility: Secondary | ICD-10-CM | POA: Diagnosis not present

## 2021-05-29 DIAGNOSIS — I10 Essential (primary) hypertension: Secondary | ICD-10-CM | POA: Diagnosis not present

## 2021-05-29 DIAGNOSIS — J449 Chronic obstructive pulmonary disease, unspecified: Secondary | ICD-10-CM | POA: Diagnosis not present

## 2021-05-29 DIAGNOSIS — K5900 Constipation, unspecified: Secondary | ICD-10-CM | POA: Diagnosis not present

## 2021-05-29 DIAGNOSIS — R6889 Other general symptoms and signs: Secondary | ICD-10-CM | POA: Diagnosis not present

## 2021-05-29 DIAGNOSIS — M25552 Pain in left hip: Secondary | ICD-10-CM | POA: Diagnosis not present

## 2021-05-29 DIAGNOSIS — E11319 Type 2 diabetes mellitus with unspecified diabetic retinopathy without macular edema: Secondary | ICD-10-CM | POA: Diagnosis not present

## 2021-05-29 DIAGNOSIS — E119 Type 2 diabetes mellitus without complications: Secondary | ICD-10-CM | POA: Diagnosis not present

## 2021-05-29 DIAGNOSIS — S72002D Fracture of unspecified part of neck of left femur, subsequent encounter for closed fracture with routine healing: Secondary | ICD-10-CM | POA: Diagnosis not present

## 2021-05-29 DIAGNOSIS — S72032D Displaced midcervical fracture of left femur, subsequent encounter for closed fracture with routine healing: Secondary | ICD-10-CM | POA: Diagnosis not present

## 2021-05-29 DIAGNOSIS — M1990 Unspecified osteoarthritis, unspecified site: Secondary | ICD-10-CM | POA: Diagnosis not present

## 2021-05-29 LAB — RESP PANEL BY RT-PCR (FLU A&B, COVID) ARPGX2
Influenza A by PCR: NEGATIVE
Influenza B by PCR: NEGATIVE
SARS Coronavirus 2 by RT PCR: NEGATIVE

## 2021-05-29 LAB — CBC
HCT: 32.8 % — ABNORMAL LOW (ref 39.0–52.0)
Hemoglobin: 10.2 g/dL — ABNORMAL LOW (ref 13.0–17.0)
MCH: 28 pg (ref 26.0–34.0)
MCHC: 31.1 g/dL (ref 30.0–36.0)
MCV: 90.1 fL (ref 80.0–100.0)
Platelets: 142 10*3/uL — ABNORMAL LOW (ref 150–400)
RBC: 3.64 MIL/uL — ABNORMAL LOW (ref 4.22–5.81)
RDW: 12.3 % (ref 11.5–15.5)
WBC: 9.7 10*3/uL (ref 4.0–10.5)
nRBC: 0 % (ref 0.0–0.2)

## 2021-05-29 LAB — BASIC METABOLIC PANEL
Anion gap: 11 (ref 5–15)
BUN: 15 mg/dL (ref 8–23)
CO2: 26 mmol/L (ref 22–32)
Calcium: 10.1 mg/dL (ref 8.9–10.3)
Chloride: 105 mmol/L (ref 98–111)
Creatinine, Ser: 0.99 mg/dL (ref 0.61–1.24)
GFR, Estimated: >60 mL/min (ref >60)
Glucose, Bld: 143 mg/dL — ABNORMAL HIGH (ref 70–99)
Potassium: 3.8 mmol/L (ref 3.5–5.1)
Sodium: 142 mmol/L (ref 135–145)

## 2021-05-29 LAB — GLUCOSE, CAPILLARY
Glucose-Capillary: 139 mg/dL — ABNORMAL HIGH (ref 70–99)
Glucose-Capillary: 140 mg/dL — ABNORMAL HIGH (ref 70–99)
Glucose-Capillary: 222 mg/dL — ABNORMAL HIGH (ref 70–99)

## 2021-05-29 MED ORDER — ADULT MULTIVITAMIN W/MINERALS CH: 1.0000 | ORAL_TABLET | Freq: Every day | ORAL | Status: AC

## 2021-05-29 MED ORDER — POLYETHYLENE GLYCOL 3350 17 G PO PACK: 17.0000 g | PACK | Freq: Two times a day (BID) | ORAL | 0 refills | Status: AC

## 2021-05-29 MED ORDER — SENNOSIDES-DOCUSATE SODIUM 8.6-50 MG PO TABS: 2.0000 | ORAL_TABLET | Freq: Two times a day (BID) | ORAL | Status: AC

## 2021-05-29 MED ORDER — FLEET ENEMA 7-19 GM/118ML RE ENEM
1.0000 | ENEMA | Freq: Once | RECTAL | Status: AC
  Administered 2021-05-29: 1 via RECTAL
  Filled 2021-05-29: qty 1

## 2021-05-29 MED ORDER — METHOCARBAMOL 500 MG PO TABS: 500.0000 mg | ORAL_TABLET | Freq: Three times a day (TID) | ORAL | Status: AC | PRN

## 2021-05-29 NOTE — Progress Notes (Signed)
Physical Therapy Treatment Patient Details Name: Hayden Green MRN: [id number] DOB: 1928-03-24 Today's Date: 05/29/2021    History of Present Illness 87 y.o. male with medical history significant of COPD, essential hypertension, type 2 diabetes mellitus, and history of renal cell carcinoma in remission presented after sustaining a fall while he was on 4 feet ladder to pick apples and found to have fracture of his left femur.  Pt s/p Left femur IM nail 05/25/21.    PT Comments    Pt very cooperative and progressing but very slowly with mobility.  Pt continues to require increased time and significant assist of 2 for performance of all mobility tasks and would benefit from follow up rehab at SNF level to maximize IND and safety.prior to dc home.   Follow Up Recommendations  SNF     Equipment Recommendations  Wheelchair (measurements PT);Wheelchair cushion (measurements PT);Hospital bed    Recommendations for Other Services       Precautions / Restrictions Precautions Precautions: Fall Restrictions Weight Bearing Restrictions: No Other Position/Activity Restrictions: WBAT    Mobility  Bed Mobility Overal bed mobility: Needs Assistance Bed Mobility: Sit to Supine       Sit to supine: +2 for physical assistance;Mod assist   General bed mobility comments: cues for sequence, assist to manage LEs and to control trunk    Transfers Overall transfer level: Needs assistance Equipment used: Rolling walker (2 wheeled) Transfers: Sit to/from Stand Sit to Stand: Max assist;+2 physical assistance         General transfer comment: cues for LE management and use of UEs to self assist.  Physical assist to bring wt up and fwd and to balance in standing.  Ambulation/Gait Ambulation/Gait assistance: Mod assist;+2 physical assistance;+2 safety/equipment Gait Distance (Feet): 4 Feet Assistive device: Rolling walker (2 wheeled) Gait Pattern/deviations: Step-to pattern;Decreased step length  - right;Decreased step length - left;Decreased stance time - left;Shuffle;Trunk flexed Gait velocity: decr   General Gait Details: Increased time with cues for sequence, posture and position from RW.  Physical assist for balance/support, walker management and to advance L LE.  Pt struggling to WB on L LE and with cues to increase UE WB.  Bed pulled behind pt 2* fatigue   Stairs             Wheelchair Mobility    Modified Rankin (Stroke Patients Only)       Balance Overall balance assessment: Needs assistance Sitting-balance support: No upper extremity supported;Feet supported Sitting balance-Leahy Scale: Good     Standing balance support: Bilateral upper extremity supported Standing balance-Leahy Scale: Poor                              Cognition Arousal/Alertness: Awake/alert Behavior During Therapy: WFL for tasks assessed/performed Overall Cognitive Status: Within Functional Limits for tasks assessed                                        Exercises General Exercises - Lower Extremity Ankle Circles/Pumps: AROM;Both;15 reps;Supine Quad Sets: AROM;Both;10 reps;Supine Heel Slides: AAROM;Left;10 reps;Supine Hip ABduction/ADduction: AAROM;Left;10 reps;Supine    General Comments        Pertinent Vitals/Pain Pain Assessment: 0-10 Pain Score: 7  Pain Location: left hip with activity Pain Descriptors / Indicators: Sore;Aching Pain Intervention(s): Limited activity within patient's tolerance;Monitored during session;Premedicated before session;Ice applied  Home Living                      Prior Function            PT Goals (current goals can now be found in the care plan section) Acute Rehab PT Goals PT Goal Formulation: With patient Time For Goal Achievement: 06/09/21 Potential to Achieve Goals: Fair Progress towards PT goals: Progressing toward goals    Frequency    Min 3X/week      PT Plan Current plan  remains appropriate    Co-evaluation              AM-PAC PT "6 Clicks" Mobility   Outcome Measure  Help needed turning from your back to your side while in a flat bed without using bedrails?: A Lot Help needed moving from lying on your back to sitting on the side of a flat bed without using bedrails?: A Lot Help needed moving to and from a bed to a chair (including a wheelchair)?: A Lot Help needed standing up from a chair using your arms (e.g., wheelchair or bedside chair)?: A Lot Help needed to walk in hospital room?: A Lot Help needed climbing 3-5 steps with a railing? : Total 6 Click Score: 11    End of Session Equipment Utilized During Treatment: Gait belt Activity Tolerance: Patient limited by fatigue;Patient limited by pain Patient left: in bed;with call bell/phone within reach;with bed alarm set;with family/visitor present Nurse Communication: Mobility status PT Visit Diagnosis: Other abnormalities of gait and mobility (R26.89)     Time: 0950-1020 PT Time Calculation (min) (ACUTE ONLY): 30 min  Charges:  $Gait Training: 8-22 mins $Therapeutic Exercise: 8-22 mins                     [place] Pager [phone] Office [phone]    Kalyn Dimattia 05/29/2021, 12:19 PM

## 2021-05-29 NOTE — Plan of Care (Signed)
Plan of care reviewed and discussed with the patient. 

## 2021-05-29 NOTE — Discharge Summary (Addendum)
[facility]  Physician Discharge Summary   Patient ID: Hayden Green MRN: [id number] DOB/AGE: [birth year] 87 y.o.  Admit date: 05/24/2021 Discharge date:   05/29/2021   PCP: Rosita Fire, MD  DISCHARGE DIAGNOSES:  Left intertrochanteric fracture status post ORIF Constipation, resolved Postoperative anemia due to blood loss, stable [place] mellitus type II with diabetic retinopathy History of glaucoma Essential hypertension History of COPD   RECOMMENDATIONS FOR OUTPATIENT FOLLOW UP: Please check CBC and basic metabolic panel in 3 to 4 days and then per SNF protocol    Home Health: Going to SNF Equipment/Devices: None  CODE STATUS: Full code  DISCHARGE CONDITION: fair  Diet recommendation: Modified carbohydrate/heart healthy  INITIAL HISTORY: 87 y.o. male with medical history significant of COPD, essential hypertension, type 2 diabetes mellitus, and history of renal cell carcinoma in remission presented after sustaining a fall while he was on 4 feet ladder to pick apples.  Taken to the emergency department at [facility] where he was found to have fracture of his left femur.  Subsequently transferred over to [facility] for further management.       Consultants: Orthopedics   Procedures: Intramedullary fixation, Left femur. 7/21   HOSPITAL COURSE:   Left intertrochanteric fracture Secondary to mechanical fall.  Seen by orthopedics and underwent surgery on 7/21.  Seen by PT and skilled nursing facility is recommended for rehab.  Aspirin for DVT prophylaxis per orthopedics   Constipation Patient developed constipation in the hospital likely due to surgery, pain issues, medications and lack of mobility.  He was started on laxatives.  Subsequently given enema with good response.  Continue bowel regimen at SNF.   Normocytic anemia/postoperative anemia due to blood loss Drop in hemoglobin likely multifactorial including blood loss from surgery and fracture.   Globin has been stable over the last 3-40  Diabetes mellitus type 2 with diabetic retinopathy HbA1c 5.6.  Resume home medications.   History of glaucoma and diabetic retinopathy Continue with eyedrops.  Essential hypertension/hypokalemia Continue with home medication regimen including lisinopril HCTZ.  Blood pressure remains reasonably well controlled.  Potassium was supplemented  History of COPD Seems to be stable from a respiratory standpoint.  History of renal cell carcinoma in remission Stable  History of GERD Continue PPI   Mild hyponatremia Resolved.  Overall patient is stable.  Okay for discharge to SNF.   PERTINENT LABS:  The results of significant diagnostics from this hospitalization (including imaging, microbiology, ancillary and laboratory) are listed below for reference.    Microbiology: Recent Results (from the past 240 hour(s))  Resp Panel by RT-PCR (Flu A&B, Covid) Nasopharyngeal Swab     Status: None   Collection Time: 05/24/21 12:34 PM   Specimen: Nasopharyngeal Swab; Nasopharyngeal(NP) swabs in vial transport medium  Result Value Ref Range Status   SARS Coronavirus 2 by RT PCR NEGATIVE NEGATIVE Final    Comment: (NOTE) SARS-CoV-2 target nucleic acids are NOT DETECTED.  The SARS-CoV-2 RNA is generally detectable in upper respiratory specimens during the acute phase of infection. The lowest concentration of SARS-CoV-2 viral copies this assay can detect is 138 copies/mL. A negative result does not preclude SARS-Cov-2 infection and should not be used as the sole basis for treatment or other patient management decisions. A negative result may occur with  improper specimen collection/handling, submission of specimen other than nasopharyngeal swab, presence of viral mutation(s) within the areas targeted by this assay, and inadequate number of viral copies(<138 copies/mL). A negative result must be combined with  clinical observations, patient history, and  epidemiological information. The expected result is Negative.  Fact Sheet for Patients:  EntrepreneurPulse.com.au  Fact Sheet for Healthcare Providers:  IncredibleEmployment.be  This test is no t yet approved or cleared by the [place] FDA and  has been authorized for detection and/or diagnosis of SARS-CoV-2 by FDA under an Emergency Use Authorization (EUA). This EUA will remain  in effect (meaning this test can be used) for the duration of the COVID-19 declaration under Section 564(b)(1) of the Act, 21 U.S.C.section 360bbb-3(b)(1), unless the authorization is terminated  or revoked sooner.       Influenza A by PCR NEGATIVE NEGATIVE Final   Influenza B by PCR NEGATIVE NEGATIVE Final    Comment: (NOTE) The Xpert Xpress SARS-CoV-2/FLU/RSV plus assay is intended as an aid in the diagnosis of influenza from Nasopharyngeal swab specimens and should not be used as a sole basis for treatment. Nasal washings and aspirates are unacceptable for Xpert Xpress SARS-CoV-2/FLU/RSV testing.  Fact Sheet for Patients: EntrepreneurPulse.com.au  Fact Sheet for Healthcare Providers: IncredibleEmployment.be  This test is not yet approved or cleared by the [place] FDA and has been authorized for detection and/or diagnosis of SARS-CoV-2 by FDA under an Emergency Use Authorization (EUA). This EUA will remain in effect (meaning this test can be used) for the duration of the COVID-19 declaration under Section 564(b)(1) of the Act, 21 U.S.C. section 360bbb-3(b)(1), unless the authorization is terminated or revoked.  Performed at [facility], [address], [place], [place] [address]   Surgical PCR screen     Status: None   Collection Time: 05/24/21  6:44 PM   Specimen: Nasal Mucosa; Nasal Swab  Result Value Ref Range Status   MRSA, PCR NEGATIVE NEGATIVE Final   Staphylococcus aureus NEGATIVE NEGATIVE Final     Comment: (NOTE) The Xpert SA Assay (FDA approved for NASAL specimens in patients 69 years of age and older), is one component of a comprehensive surveillance program. It is not intended to diagnose infection nor to guide or monitor treatment. Performed at [facility], [place] [address], [place], [place] [address]   Resp Panel by RT-PCR (Flu A&B, Covid) Nasopharyngeal Swab     Status: None   Collection Time: 05/29/21 10:38 AM   Specimen: Nasopharyngeal Swab; Nasopharyngeal(NP) swabs in vial transport medium  Result Value Ref Range Status   SARS Coronavirus 2 by RT PCR NEGATIVE NEGATIVE Final    Comment: (NOTE) SARS-CoV-2 target nucleic acids are NOT DETECTED.  The SARS-CoV-2 RNA is generally detectable in upper respiratory specimens during the acute phase of infection. The lowest concentration of SARS-CoV-2 viral copies this assay can detect is 138 copies/mL. A negative result does not preclude SARS-Cov-2 infection and should not be used as the sole basis for treatment or other patient management decisions. A negative result may occur with  improper specimen collection/handling, submission of specimen other than nasopharyngeal swab, presence of viral mutation(s) within the areas targeted by this assay, and inadequate number of viral copies(<138 copies/mL). A negative result must be combined with clinical observations, patient history, and epidemiological information. The expected result is Negative.  Fact Sheet for Patients:  EntrepreneurPulse.com.au  Fact Sheet for Healthcare Providers:  IncredibleEmployment.be  This test is no t yet approved or cleared by the [place] FDA and  has been authorized for detection and/or diagnosis of SARS-CoV-2 by FDA under an Emergency Use Authorization (EUA). This EUA will remain  in effect (meaning this test can be used)  for the duration of the COVID-19 declaration under Section 564(b)(1)  of the Act, 21 U.S.C.section 360bbb-3(b)(1), unless the authorization is terminated  or revoked sooner.       Influenza A by PCR NEGATIVE NEGATIVE Final   Influenza B by PCR NEGATIVE NEGATIVE Final    Comment: (NOTE) The Xpert Xpress SARS-CoV-2/FLU/RSV plus assay is intended as an aid in the diagnosis of influenza from Nasopharyngeal swab specimens and should not be used as a sole basis for treatment. Nasal washings and aspirates are unacceptable for Xpert Xpress SARS-CoV-2/FLU/RSV testing.  Fact Sheet for Patients: EntrepreneurPulse.com.au  Fact Sheet for Healthcare Providers: IncredibleEmployment.be  This test is not yet approved or cleared by the [place] FDA and has been authorized for detection and/or diagnosis of SARS-CoV-2 by FDA under an Emergency Use Authorization (EUA). This EUA will remain in effect (meaning this test can be used) for the duration of the COVID-19 declaration under Section 564(b)(1) of the Act, 21 U.S.C. section 360bbb-3(b)(1), unless the authorization is terminated or revoked.  Performed at [facility], [place] [address] [place], [place] [address]      Labs:  COVID-19 Labs  Lab Results  Component Value Date   [place] 05/29/2021   [place] NEGATIVE 05/24/2021      Basic Metabolic Panel: Recent Labs  Lab 05/25/21 0323 05/26/21 0321 05/27/21 0231 05/28/21 0326 05/29/21 0314  NA 136 137 137 139 142  K 3.7 3.8 3.3* 3.7 3.8  CL 107 109 106 105 105  CO2 '23 25 23 27 26  '$ GLUCOSE 142* 194* 148* 138* 143*  BUN '15 16 14 16 15  '$ CREATININE 1.05 1.09 1.00 1.03 0.99  CALCIUM 9.1 8.7* 9.0 9.4 10.1  MG 1.7  --   --  2.1  --    Liver Function Tests: Recent Labs  Lab 05/24/21 1245 05/25/21 0323  AST 22 21  ALT 11 10  ALKPHOS 67 54  BILITOT 0.8 0.9  PROT 6.5 5.8*  ALBUMIN 4.0 3.5    CBC: Recent Labs  Lab 05/24/21 1245 05/25/21 0323 05/26/21 0321  05/27/21 0231 05/28/21 0326 05/29/21 0314  WBC 8.8 9.0 11.4* 10.9* 9.6 9.7  NEUTROABS 7.0  --   --   --   --   --   HGB 11.3* 9.8* 9.1* 9.4* 9.2* 10.2*  HCT 36.2* 32.0* 29.4* 30.4* 30.1* 32.8*  MCV 91.0 91.2 91.9 91.0 90.7 90.1  PLT 141* 132* 115* 116* 123* 142*     CBG: Recent Labs  Lab 05/28/21 1156 05/28/21 1636 05/28/21 2121 05/29/21 0339 05/29/21 0751  GLUCAP 156* 155* 162* 139* 140*     IMAGING STUDIES DG Chest 1 View  Result Date: 05/24/2021 CLINICAL DATA:  Pt had a fall earlier today from a ladder. Hx of COPD, HTN, pneumonia,and former smoker. Covid test pending. EXAM: CHEST  1 VIEW COMPARISON:  09/24/2016 FINDINGS: Relatively low lung volumes with resultant crowding of bronchovascular structures, and suspicion of mild central pulmonary vascular congestion. 7 mm nodular density projects over the mid right lung, present since 2013 presumably benign. Heart size upper limits normal for technique. Aortic Atherosclerosis (ICD10-170.0). No effusion.  No pneumothorax. Visualized bones unremarkable. Cholecystectomy clips. Stable sclerosis in the right humeral head. IMPRESSION: Low volumes, possible mild pulmonary vascular congestion Electronically Signed   By: Lucrezia Europe M.D.   On: 05/24/2021 13:43   Pelvis Portable  Result Date: 05/25/2021 CLINICAL DATA:  Status post left hip IM nail. EXAM: PORTABLE PELVIS 1-2 VIEWS COMPARISON:  Preoperative radiograph yesterday FINDINGS: Intramedullary nail with trans trochanteric and distal screw fixation of intertrochanteric femur fracture. No periprosthetic lucency. Recent postsurgical change includes air and edema in the soft tissues as well as skin staples laterally. IMPRESSION: ORIF of intertrochanteric femur fracture without immediate postoperative complication. Electronically Signed   By: Keith Rake M.D.   On: 05/25/2021 14:40   DG C-Arm 1-60 Min-No Report  Result Date: 05/25/2021 Fluoroscopy was utilized by the requesting  physician.  No radiographic interpretation.   DG HIP OPERATIVE UNILAT W OR W/O PELVIS LEFT  Result Date: 05/25/2021 CLINICAL DATA:  Internal fixation of left femur fracture. EXAM: OPERATIVE LEFT HIP (WITH PELVIS IF PERFORMED) 2 VIEWS TECHNIQUE: Fluoroscopic spot image(s) were submitted for interpretation post-operatively. COMPARISON:  05/24/2021 FINDINGS: Internal fixation of left intertrochanteric hip fracture with a dynamic hip screw. Short intramedullary nail in the proximal left femur with a distal interlocking screw. Left hip is located on these images. IMPRESSION: Internal fixation of left intertrochanteric femur fracture. Electronically Signed   By: Markus Daft M.D.   On: 05/25/2021 12:22   DG Hip Unilat With Pelvis 2-3 Views Left  Result Date: 05/24/2021 CLINICAL DATA:  Left hip pain post fall off ladder EXAM: DG HIP (WITH OR WITHOUT PELVIS) 2-3V LEFT COMPARISON:  CT 07/02/2020 FINDINGS: Comminuted left intertrochanteric fracture with mild varus deformity. No dislocation. Pelvic ring appears intact. IMPRESSION: Comminuted left IT femur fracture Electronically Signed   By: Lucrezia Europe M.D.   On: 05/24/2021 13:55    DISCHARGE EXAMINATION: Vitals:   05/28/21 0618 05/28/21 1311 05/28/21 2122 05/29/21 0448  BP: 139/64 132/63 124/61 (!) 148/72  Pulse: 74 72 71 81  Resp: '17 18 16 19  '$ Temp: 99.2 F (37.3 C) 98.8 F (37.1 C) 98.7 F (37.1 C) 99.4 F (37.4 C)  TempSrc: Oral Oral Oral Oral  SpO2: 97% 97% 100% 97%  Weight:      Height:       General appearance: Awake alert.  In no distress Resp: Clear to auscultation bilaterally.  Normal effort Cardio: S1-S2 is normal regular.  No S3-S4.  No rubs murmurs or bruit GI: Abdomen is soft.  Nontender nondistended.  Bowel sounds are present normal.  No masses organomegaly Extremities: No significant swelling involving the left hip.  No significant bruising.   DISPOSITION: SNF  Discharge Instructions     Call MD for:  difficulty breathing,  headache or visual disturbances   Complete by: As directed    Call MD for:  extreme fatigue   Complete by: As directed    Call MD for:  persistant dizziness or light-headedness   Complete by: As directed    Call MD for:  persistant nausea and vomiting   Complete by: As directed    Call MD for:  redness, tenderness, or signs of infection (pain, swelling, redness, odor or green/yellow discharge around incision site)   Complete by: As directed    Call MD for:  severe uncontrolled pain   Complete by: As directed    Call MD for:  temperature >100.4   Complete by: As directed    Diet - low sodium heart healthy   Complete by: As directed    Diet Carb Modified   Complete by: As directed    Discharge instructions   Complete by: As directed    Please review instructions on the discharge summary.  You were cared for by a hospitalist during your hospital stay. If you have any questions about your discharge  medications or the care you received while you were in the hospital after you are discharged, you can call the unit and asked to speak with the hospitalist on call if the hospitalist that took care of you is not available. Once you are discharged, your primary care physician will handle any further medical issues. Please note that NO REFILLS for any discharge medications will be authorized once you are discharged, as it is imperative that you return to your primary care physician (or establish a relationship with a primary care physician if you do not have one) for your aftercare needs so that they can reassess your need for medications and monitor your lab values. If you do not have a primary care physician, you can call [phone] for a physician referral.   Discharge wound care:   Complete by: As directed    As instructed by orthopedics   Increase activity slowly   Complete by: As directed           Allergies as of 05/29/2021       Reactions   Reglan [metoclopramide] Swelling, Other (See  Comments)   Caused oral swelling-states that this medication was taken once or twice in the past and the reaction was the same each time        Medication List     TAKE these medications    Accu-Chek FastClix Lancets Misc   Accu-Chek Guide test strip Generic drug: glucose blood   albuterol 108 (90 Base) MCG/ACT inhaler Commonly known as: VENTOLIN HFA Inhale 2 puffs into the lungs every 6 (six) hours as needed for wheezing or shortness of breath.   aspirin 81 MG chewable tablet Commonly known as: Aspirin Childrens Chew 1 tablet (81 mg total) by mouth 2 (two) times daily with a meal.   finasteride 5 MG tablet Commonly known as: PROSCAR Take 1 tablet (5 mg total) by mouth daily.   HYDROcodone-acetaminophen 5-325 MG tablet Commonly known as: NORCO/VICODIN Take 1-2 tablets by mouth every 4 (four) hours as needed for moderate pain (pain score 4-6).   ketorolac 0.5 % ophthalmic solution Commonly known as: ACULAR Place 1 drop into the right eye 4 (four) times daily.   lisinopril-hydrochlorothiazide 20-12.5 MG tablet Commonly known as: ZESTORETIC Take 1 tablet by mouth daily.   Lumigan 0.01 % Soln Generic drug: bimatoprost Place 1 drop into both eyes at bedtime.   meclizine 25 MG tablet Commonly known as: ANTIVERT Take 25 mg by mouth 4 (four) times daily as needed for dizziness.   metFORMIN 500 MG tablet Commonly known as: Glucophage Take 1 tablet (500 mg total) by mouth 2 (two) times daily with a meal.   methocarbamol 500 MG tablet Commonly known as: ROBAXIN Take 1 tablet (500 mg total) by mouth every 8 (eight) hours as needed for muscle spasms.   multivitamin with minerals Tabs tablet Take 1 tablet by mouth daily. Start taking on: May 30, 2021   naproxen sodium 220 MG tablet Commonly known as: ALEVE Take 220 mg by mouth daily as needed (pain).   omeprazole 20 MG capsule Commonly known as: PRILOSEC Take 20 mg by mouth daily as needed (for acid reflux).    polyethylene glycol 17 g packet Commonly known as: MIRALAX / GLYCOLAX Take 17 g by mouth 2 (two) times daily.   senna-docusate 8.6-50 MG tablet Commonly known as: Senokot-S Take 2 tablets by mouth 2 (two) times daily.   Simbrinza 1-0.2 % Susp Generic drug: Brinzolamide-Brimonidine Place 1 drop into both eyes  2 (two) times daily.   Vyzulta 0.024 % Soln Generic drug: Latanoprostene Bunod Place 1 drop into both eyes at bedtime.               Discharge Care Instructions  (From admission, onward)           Start     Ordered   05/29/21 0000  Discharge wound care:       Comments: As instructed by orthopedics   05/29/21 1138              Contact information for follow-up providers     Swinteck, Aaron Edelman, MD. Schedule an appointment as soon as possible for a visit in 2 week(s).   Specialty: Orthopedic Surgery Why: For suture removal, For wound re-check Contact information: [address] [place] [address] [phone]              Contact information for after-discharge care     [facility] .   Service: Skilled Nursing Contact information: [address] [phone]                     TOTAL DISCHARGE TIME: 10 minutes  [facility] Pager on [email or web address]  05/29/2021, 11:38 AM

## 2021-05-29 NOTE — TOC Transition Note (Signed)
Transition of Care [facility]) - CM/SW Discharge Note  Patient Details  Name: Hayden Green MRN: [id number] Date of Birth: 06-Nov-1927  [facility] CM/SW Contact:  Sherie Don, LCSW Phone Number: 05/29/2021, 12:21 PM  Clinical Narrative: Mardene Celeste with [facility] made bed offer. Insurance authorization is complete. Patient will go to room [address] and the number for report is [phone]. Discharge summary, discharge orders, and SNF transfer report sent to facility in hub. COVID test is negative. PTAR scheduled; medical necessity form done. Discharge packet completed. CSW updated wife and Therapist, sports. TOC signing off.  Final next level of care: [place] Barriers to Discharge: Barriers Resolved  Patient Goals and CMS Choice Patient states their goals for this hospitalization and ongoing recovery are:: Go to rehab in [facility].gov Compare Post Acute Care list provided to:: Patient Choice offered to / list presented to : Patient, Spouse  Discharge Placement PASRR number recieved: 05/26/21       Patient [facility] bed at: [facility] Patient to be transferred to facility by: [place] Name of family member notified: Shawntez Calderone (wife) Patient and family notified of of transfer: 05/29/21  Discharge Plan and Services In-house Referral: Clinical Social Work Post Acute Care Choice: [place]          DME Arranged: N/A DME Agency: NA  Readmission Risk Interventions No flowsheet data found.

## 2021-05-30 DIAGNOSIS — R531 Weakness: Secondary | ICD-10-CM | POA: Diagnosis not present

## 2021-05-30 DIAGNOSIS — E119 Type 2 diabetes mellitus without complications: Secondary | ICD-10-CM | POA: Diagnosis not present

## 2021-05-30 DIAGNOSIS — I10 Essential (primary) hypertension: Secondary | ICD-10-CM | POA: Diagnosis not present

## 2021-05-30 DIAGNOSIS — M25552 Pain in left hip: Secondary | ICD-10-CM | POA: Diagnosis not present

## 2021-06-09 DIAGNOSIS — S72032D Displaced midcervical fracture of left femur, subsequent encounter for closed fracture with routine healing: Secondary | ICD-10-CM | POA: Diagnosis not present

## 2021-06-27 DIAGNOSIS — I10 Essential (primary) hypertension: Secondary | ICD-10-CM | POA: Diagnosis not present

## 2021-06-27 DIAGNOSIS — J449 Chronic obstructive pulmonary disease, unspecified: Secondary | ICD-10-CM | POA: Diagnosis not present

## 2021-06-27 DIAGNOSIS — H409 Unspecified glaucoma: Secondary | ICD-10-CM | POA: Diagnosis not present

## 2021-06-27 DIAGNOSIS — Z9181 History of falling: Secondary | ICD-10-CM | POA: Diagnosis not present

## 2021-06-27 DIAGNOSIS — Z85828 Personal history of other malignant neoplasm of skin: Secondary | ICD-10-CM | POA: Diagnosis not present

## 2021-06-27 DIAGNOSIS — S72002D Fracture of unspecified part of neck of left femur, subsequent encounter for closed fracture with routine healing: Secondary | ICD-10-CM | POA: Diagnosis not present

## 2021-06-27 DIAGNOSIS — Z7984 Long term (current) use of oral hypoglycemic drugs: Secondary | ICD-10-CM | POA: Diagnosis not present

## 2021-06-27 DIAGNOSIS — E119 Type 2 diabetes mellitus without complications: Secondary | ICD-10-CM | POA: Diagnosis not present

## 2021-06-28 ENCOUNTER — Other Ambulatory Visit (HOSPITAL_COMMUNITY)
Admission: RE | Admit: 2021-06-28 | Discharge: 2021-06-28 | Disposition: A | Discharge status: Home / Self Care | Payer: Medicare Other | Source: Ambulatory Visit | Attending: Internal Medicine | Admitting: Internal Medicine

## 2021-06-28 ENCOUNTER — Other Ambulatory Visit: Payer: Self-pay

## 2021-06-28 DIAGNOSIS — E118 Type 2 diabetes mellitus with unspecified complications: Secondary | ICD-10-CM | POA: Diagnosis not present

## 2021-06-28 DIAGNOSIS — R600 Localized edema: Secondary | ICD-10-CM | POA: Diagnosis not present

## 2021-06-28 DIAGNOSIS — I10 Essential (primary) hypertension: Secondary | ICD-10-CM | POA: Diagnosis not present

## 2021-06-28 DIAGNOSIS — E1142 Type 2 diabetes mellitus with diabetic polyneuropathy: Secondary | ICD-10-CM | POA: Diagnosis not present

## 2021-06-28 DIAGNOSIS — S7292XD Unspecified fracture of left femur, subsequent encounter for closed fracture with routine healing: Secondary | ICD-10-CM | POA: Diagnosis not present

## 2021-06-28 LAB — CBC WITH DIFFERENTIAL/PLATELET
Abs Immature Granulocytes: 0.01 10*3/uL (ref 0.00–0.07)
Basophils Absolute: 0 10*3/uL (ref 0.0–0.1)
Basophils Relative: 1 %
Eosinophils Absolute: 0.6 10*3/uL — ABNORMAL HIGH (ref 0.0–0.5)
Eosinophils Relative: 9 %
HCT: 33.3 % — ABNORMAL LOW (ref 39.0–52.0)
Hemoglobin: 10.2 g/dL — ABNORMAL LOW (ref 13.0–17.0)
Immature Granulocytes: 0 %
Lymphocytes Relative: 33 %
Lymphs Abs: 2 10*3/uL (ref 0.7–4.0)
MCH: 28.3 pg (ref 26.0–34.0)
MCHC: 30.6 g/dL (ref 30.0–36.0)
MCV: 92.2 fL (ref 80.0–100.0)
Monocytes Absolute: 0.5 10*3/uL (ref 0.1–1.0)
Monocytes Relative: 9 %
Neutro Abs: 2.9 10*3/uL (ref 1.7–7.7)
Neutrophils Relative %: 48 %
Platelets: 186 10*3/uL (ref 150–400)
RBC: 3.61 MIL/uL — ABNORMAL LOW (ref 4.22–5.81)
RDW: 12.1 % (ref 11.5–15.5)
WBC: 6 10*3/uL (ref 4.0–10.5)
nRBC: 0 % (ref 0.0–0.2)

## 2021-06-28 LAB — BASIC METABOLIC PANEL
Anion gap: 7 (ref 5–15)
BUN: 21 mg/dL (ref 8–23)
CO2: 24 mmol/L (ref 22–32)
Calcium: 9.7 mg/dL (ref 8.9–10.3)
Chloride: 101 mmol/L (ref 98–111)
Creatinine, Ser: 1.21 mg/dL (ref 0.61–1.24)
GFR, Estimated: 57 mL/min — ABNORMAL LOW (ref >60)
Glucose, Bld: 152 mg/dL — ABNORMAL HIGH (ref 70–99)
Potassium: 4 mmol/L (ref 3.5–5.1)
Sodium: 132 mmol/L — ABNORMAL LOW (ref 135–145)

## 2021-06-28 LAB — HEMOGLOBIN A1C
Hgb A1c MFr Bld: 5.3 % (ref 4.8–5.6)
Mean Plasma Glucose: 105.41 mg/dL

## 2021-06-29 DIAGNOSIS — Z9181 History of falling: Secondary | ICD-10-CM | POA: Diagnosis not present

## 2021-06-29 DIAGNOSIS — J449 Chronic obstructive pulmonary disease, unspecified: Secondary | ICD-10-CM | POA: Diagnosis not present

## 2021-06-29 DIAGNOSIS — H409 Unspecified glaucoma: Secondary | ICD-10-CM | POA: Diagnosis not present

## 2021-06-29 DIAGNOSIS — Z85828 Personal history of other malignant neoplasm of skin: Secondary | ICD-10-CM | POA: Diagnosis not present

## 2021-06-29 DIAGNOSIS — S72002D Fracture of unspecified part of neck of left femur, subsequent encounter for closed fracture with routine healing: Secondary | ICD-10-CM | POA: Diagnosis not present

## 2021-06-29 DIAGNOSIS — Z7984 Long term (current) use of oral hypoglycemic drugs: Secondary | ICD-10-CM | POA: Diagnosis not present

## 2021-06-29 DIAGNOSIS — E119 Type 2 diabetes mellitus without complications: Secondary | ICD-10-CM | POA: Diagnosis not present

## 2021-06-29 DIAGNOSIS — I10 Essential (primary) hypertension: Secondary | ICD-10-CM | POA: Diagnosis not present

## 2021-07-05 DIAGNOSIS — Z7984 Long term (current) use of oral hypoglycemic drugs: Secondary | ICD-10-CM | POA: Diagnosis not present

## 2021-07-05 DIAGNOSIS — S72002D Fracture of unspecified part of neck of left femur, subsequent encounter for closed fracture with routine healing: Secondary | ICD-10-CM | POA: Diagnosis not present

## 2021-07-05 DIAGNOSIS — J449 Chronic obstructive pulmonary disease, unspecified: Secondary | ICD-10-CM | POA: Diagnosis not present

## 2021-07-05 DIAGNOSIS — Z85828 Personal history of other malignant neoplasm of skin: Secondary | ICD-10-CM | POA: Diagnosis not present

## 2021-07-05 DIAGNOSIS — E119 Type 2 diabetes mellitus without complications: Secondary | ICD-10-CM | POA: Diagnosis not present

## 2021-07-05 DIAGNOSIS — H409 Unspecified glaucoma: Secondary | ICD-10-CM | POA: Diagnosis not present

## 2021-07-05 DIAGNOSIS — I10 Essential (primary) hypertension: Secondary | ICD-10-CM | POA: Diagnosis not present

## 2021-07-05 DIAGNOSIS — Z9181 History of falling: Secondary | ICD-10-CM | POA: Diagnosis not present

## 2021-07-06 DIAGNOSIS — Z7984 Long term (current) use of oral hypoglycemic drugs: Secondary | ICD-10-CM | POA: Diagnosis not present

## 2021-07-06 DIAGNOSIS — J449 Chronic obstructive pulmonary disease, unspecified: Secondary | ICD-10-CM | POA: Diagnosis not present

## 2021-07-06 DIAGNOSIS — I10 Essential (primary) hypertension: Secondary | ICD-10-CM | POA: Diagnosis not present

## 2021-07-06 DIAGNOSIS — S72002D Fracture of unspecified part of neck of left femur, subsequent encounter for closed fracture with routine healing: Secondary | ICD-10-CM | POA: Diagnosis not present

## 2021-07-06 DIAGNOSIS — E119 Type 2 diabetes mellitus without complications: Secondary | ICD-10-CM | POA: Diagnosis not present

## 2021-07-06 DIAGNOSIS — H409 Unspecified glaucoma: Secondary | ICD-10-CM | POA: Diagnosis not present

## 2021-07-06 DIAGNOSIS — Z9181 History of falling: Secondary | ICD-10-CM | POA: Diagnosis not present

## 2021-07-06 DIAGNOSIS — Z85828 Personal history of other malignant neoplasm of skin: Secondary | ICD-10-CM | POA: Diagnosis not present

## 2021-07-07 DIAGNOSIS — Z85828 Personal history of other malignant neoplasm of skin: Secondary | ICD-10-CM | POA: Diagnosis not present

## 2021-07-07 DIAGNOSIS — I10 Essential (primary) hypertension: Secondary | ICD-10-CM | POA: Diagnosis not present

## 2021-07-07 DIAGNOSIS — H409 Unspecified glaucoma: Secondary | ICD-10-CM | POA: Diagnosis not present

## 2021-07-07 DIAGNOSIS — S72002D Fracture of unspecified part of neck of left femur, subsequent encounter for closed fracture with routine healing: Secondary | ICD-10-CM | POA: Diagnosis not present

## 2021-07-07 DIAGNOSIS — Z9181 History of falling: Secondary | ICD-10-CM | POA: Diagnosis not present

## 2021-07-07 DIAGNOSIS — Z7984 Long term (current) use of oral hypoglycemic drugs: Secondary | ICD-10-CM | POA: Diagnosis not present

## 2021-07-07 DIAGNOSIS — J449 Chronic obstructive pulmonary disease, unspecified: Secondary | ICD-10-CM | POA: Diagnosis not present

## 2021-07-07 DIAGNOSIS — E119 Type 2 diabetes mellitus without complications: Secondary | ICD-10-CM | POA: Diagnosis not present

## 2021-07-11 DIAGNOSIS — S72032D Displaced midcervical fracture of left femur, subsequent encounter for closed fracture with routine healing: Secondary | ICD-10-CM | POA: Diagnosis not present

## 2021-07-11 DIAGNOSIS — E119 Type 2 diabetes mellitus without complications: Secondary | ICD-10-CM | POA: Diagnosis not present

## 2021-07-11 DIAGNOSIS — Z85828 Personal history of other malignant neoplasm of skin: Secondary | ICD-10-CM | POA: Diagnosis not present

## 2021-07-11 DIAGNOSIS — Z9181 History of falling: Secondary | ICD-10-CM | POA: Diagnosis not present

## 2021-07-11 DIAGNOSIS — Z7984 Long term (current) use of oral hypoglycemic drugs: Secondary | ICD-10-CM | POA: Diagnosis not present

## 2021-07-11 DIAGNOSIS — H409 Unspecified glaucoma: Secondary | ICD-10-CM | POA: Diagnosis not present

## 2021-07-11 DIAGNOSIS — J449 Chronic obstructive pulmonary disease, unspecified: Secondary | ICD-10-CM | POA: Diagnosis not present

## 2021-07-11 DIAGNOSIS — S72002D Fracture of unspecified part of neck of left femur, subsequent encounter for closed fracture with routine healing: Secondary | ICD-10-CM | POA: Diagnosis not present

## 2021-07-11 DIAGNOSIS — I10 Essential (primary) hypertension: Secondary | ICD-10-CM | POA: Diagnosis not present

## 2021-07-12 DIAGNOSIS — S7292XD Unspecified fracture of left femur, subsequent encounter for closed fracture with routine healing: Secondary | ICD-10-CM | POA: Diagnosis not present

## 2021-07-12 DIAGNOSIS — Z9181 History of falling: Secondary | ICD-10-CM | POA: Diagnosis not present

## 2021-07-12 DIAGNOSIS — I10 Essential (primary) hypertension: Secondary | ICD-10-CM | POA: Diagnosis not present

## 2021-07-12 DIAGNOSIS — E119 Type 2 diabetes mellitus without complications: Secondary | ICD-10-CM | POA: Diagnosis not present

## 2021-07-12 DIAGNOSIS — J449 Chronic obstructive pulmonary disease, unspecified: Secondary | ICD-10-CM | POA: Diagnosis not present

## 2021-07-12 DIAGNOSIS — Z85828 Personal history of other malignant neoplasm of skin: Secondary | ICD-10-CM | POA: Diagnosis not present

## 2021-07-12 DIAGNOSIS — Z23 Encounter for immunization: Secondary | ICD-10-CM | POA: Diagnosis not present

## 2021-07-12 DIAGNOSIS — S72002D Fracture of unspecified part of neck of left femur, subsequent encounter for closed fracture with routine healing: Secondary | ICD-10-CM | POA: Diagnosis not present

## 2021-07-12 DIAGNOSIS — E1142 Type 2 diabetes mellitus with diabetic polyneuropathy: Secondary | ICD-10-CM | POA: Diagnosis not present

## 2021-07-12 DIAGNOSIS — Z7984 Long term (current) use of oral hypoglycemic drugs: Secondary | ICD-10-CM | POA: Diagnosis not present

## 2021-07-12 DIAGNOSIS — H409 Unspecified glaucoma: Secondary | ICD-10-CM | POA: Diagnosis not present

## 2021-07-13 DIAGNOSIS — H409 Unspecified glaucoma: Secondary | ICD-10-CM | POA: Diagnosis not present

## 2021-07-13 DIAGNOSIS — S72002D Fracture of unspecified part of neck of left femur, subsequent encounter for closed fracture with routine healing: Secondary | ICD-10-CM | POA: Diagnosis not present

## 2021-07-13 DIAGNOSIS — E119 Type 2 diabetes mellitus without complications: Secondary | ICD-10-CM | POA: Diagnosis not present

## 2021-07-13 DIAGNOSIS — J449 Chronic obstructive pulmonary disease, unspecified: Secondary | ICD-10-CM | POA: Diagnosis not present

## 2021-07-13 DIAGNOSIS — I10 Essential (primary) hypertension: Secondary | ICD-10-CM | POA: Diagnosis not present

## 2021-07-13 DIAGNOSIS — Z85828 Personal history of other malignant neoplasm of skin: Secondary | ICD-10-CM | POA: Diagnosis not present

## 2021-07-13 DIAGNOSIS — Z7984 Long term (current) use of oral hypoglycemic drugs: Secondary | ICD-10-CM | POA: Diagnosis not present

## 2021-07-13 DIAGNOSIS — Z9181 History of falling: Secondary | ICD-10-CM | POA: Diagnosis not present

## 2021-07-17 DIAGNOSIS — S72002D Fracture of unspecified part of neck of left femur, subsequent encounter for closed fracture with routine healing: Secondary | ICD-10-CM | POA: Diagnosis not present

## 2021-07-17 DIAGNOSIS — I10 Essential (primary) hypertension: Secondary | ICD-10-CM | POA: Diagnosis not present

## 2021-07-17 DIAGNOSIS — Z9181 History of falling: Secondary | ICD-10-CM | POA: Diagnosis not present

## 2021-07-17 DIAGNOSIS — Z7984 Long term (current) use of oral hypoglycemic drugs: Secondary | ICD-10-CM | POA: Diagnosis not present

## 2021-07-17 DIAGNOSIS — H409 Unspecified glaucoma: Secondary | ICD-10-CM | POA: Diagnosis not present

## 2021-07-17 DIAGNOSIS — J449 Chronic obstructive pulmonary disease, unspecified: Secondary | ICD-10-CM | POA: Diagnosis not present

## 2021-07-17 DIAGNOSIS — E119 Type 2 diabetes mellitus without complications: Secondary | ICD-10-CM | POA: Diagnosis not present

## 2021-07-17 DIAGNOSIS — Z85828 Personal history of other malignant neoplasm of skin: Secondary | ICD-10-CM | POA: Diagnosis not present

## 2021-07-20 DIAGNOSIS — Z85828 Personal history of other malignant neoplasm of skin: Secondary | ICD-10-CM | POA: Diagnosis not present

## 2021-07-20 DIAGNOSIS — J449 Chronic obstructive pulmonary disease, unspecified: Secondary | ICD-10-CM | POA: Diagnosis not present

## 2021-07-20 DIAGNOSIS — H409 Unspecified glaucoma: Secondary | ICD-10-CM | POA: Diagnosis not present

## 2021-07-20 DIAGNOSIS — S72002D Fracture of unspecified part of neck of left femur, subsequent encounter for closed fracture with routine healing: Secondary | ICD-10-CM | POA: Diagnosis not present

## 2021-07-20 DIAGNOSIS — I10 Essential (primary) hypertension: Secondary | ICD-10-CM | POA: Diagnosis not present

## 2021-07-20 DIAGNOSIS — Z9181 History of falling: Secondary | ICD-10-CM | POA: Diagnosis not present

## 2021-07-20 DIAGNOSIS — E119 Type 2 diabetes mellitus without complications: Secondary | ICD-10-CM | POA: Diagnosis not present

## 2021-07-20 DIAGNOSIS — Z7984 Long term (current) use of oral hypoglycemic drugs: Secondary | ICD-10-CM | POA: Diagnosis not present

## 2021-07-24 DIAGNOSIS — J449 Chronic obstructive pulmonary disease, unspecified: Secondary | ICD-10-CM | POA: Diagnosis not present

## 2021-07-24 DIAGNOSIS — S72002D Fracture of unspecified part of neck of left femur, subsequent encounter for closed fracture with routine healing: Secondary | ICD-10-CM | POA: Diagnosis not present

## 2021-07-24 DIAGNOSIS — Z9181 History of falling: Secondary | ICD-10-CM | POA: Diagnosis not present

## 2021-07-24 DIAGNOSIS — R2689 Other abnormalities of gait and mobility: Secondary | ICD-10-CM | POA: Diagnosis not present

## 2021-07-25 DIAGNOSIS — E119 Type 2 diabetes mellitus without complications: Secondary | ICD-10-CM | POA: Diagnosis not present

## 2021-07-25 DIAGNOSIS — Z7984 Long term (current) use of oral hypoglycemic drugs: Secondary | ICD-10-CM | POA: Diagnosis not present

## 2021-07-25 DIAGNOSIS — I10 Essential (primary) hypertension: Secondary | ICD-10-CM | POA: Diagnosis not present

## 2021-07-25 DIAGNOSIS — J449 Chronic obstructive pulmonary disease, unspecified: Secondary | ICD-10-CM | POA: Diagnosis not present

## 2021-07-25 DIAGNOSIS — Z85828 Personal history of other malignant neoplasm of skin: Secondary | ICD-10-CM | POA: Diagnosis not present

## 2021-07-25 DIAGNOSIS — S72002D Fracture of unspecified part of neck of left femur, subsequent encounter for closed fracture with routine healing: Secondary | ICD-10-CM | POA: Diagnosis not present

## 2021-07-25 DIAGNOSIS — H409 Unspecified glaucoma: Secondary | ICD-10-CM | POA: Diagnosis not present

## 2021-07-25 DIAGNOSIS — Z9181 History of falling: Secondary | ICD-10-CM | POA: Diagnosis not present

## 2021-07-27 DIAGNOSIS — Z9181 History of falling: Secondary | ICD-10-CM | POA: Diagnosis not present

## 2021-07-27 DIAGNOSIS — H409 Unspecified glaucoma: Secondary | ICD-10-CM | POA: Diagnosis not present

## 2021-07-27 DIAGNOSIS — J449 Chronic obstructive pulmonary disease, unspecified: Secondary | ICD-10-CM | POA: Diagnosis not present

## 2021-07-27 DIAGNOSIS — I10 Essential (primary) hypertension: Secondary | ICD-10-CM | POA: Diagnosis not present

## 2021-07-27 DIAGNOSIS — S72002D Fracture of unspecified part of neck of left femur, subsequent encounter for closed fracture with routine healing: Secondary | ICD-10-CM | POA: Diagnosis not present

## 2021-07-27 DIAGNOSIS — Z85828 Personal history of other malignant neoplasm of skin: Secondary | ICD-10-CM | POA: Diagnosis not present

## 2021-07-27 DIAGNOSIS — E119 Type 2 diabetes mellitus without complications: Secondary | ICD-10-CM | POA: Diagnosis not present

## 2021-07-27 DIAGNOSIS — Z7984 Long term (current) use of oral hypoglycemic drugs: Secondary | ICD-10-CM | POA: Diagnosis not present

## 2021-07-28 DIAGNOSIS — Z7984 Long term (current) use of oral hypoglycemic drugs: Secondary | ICD-10-CM | POA: Diagnosis not present

## 2021-07-28 DIAGNOSIS — H409 Unspecified glaucoma: Secondary | ICD-10-CM | POA: Diagnosis not present

## 2021-07-28 DIAGNOSIS — J449 Chronic obstructive pulmonary disease, unspecified: Secondary | ICD-10-CM | POA: Diagnosis not present

## 2021-07-28 DIAGNOSIS — I10 Essential (primary) hypertension: Secondary | ICD-10-CM | POA: Diagnosis not present

## 2021-07-28 DIAGNOSIS — Z9181 History of falling: Secondary | ICD-10-CM | POA: Diagnosis not present

## 2021-07-28 DIAGNOSIS — S72002D Fracture of unspecified part of neck of left femur, subsequent encounter for closed fracture with routine healing: Secondary | ICD-10-CM | POA: Diagnosis not present

## 2021-07-28 DIAGNOSIS — E119 Type 2 diabetes mellitus without complications: Secondary | ICD-10-CM | POA: Diagnosis not present

## 2021-07-28 DIAGNOSIS — Z85828 Personal history of other malignant neoplasm of skin: Secondary | ICD-10-CM | POA: Diagnosis not present

## 2021-07-31 DIAGNOSIS — M79671 Pain in right foot: Secondary | ICD-10-CM | POA: Diagnosis not present

## 2021-07-31 DIAGNOSIS — L110 Acquired keratosis follicularis: Secondary | ICD-10-CM | POA: Diagnosis not present

## 2021-07-31 DIAGNOSIS — E1140 Type 2 diabetes mellitus with diabetic neuropathy, unspecified: Secondary | ICD-10-CM | POA: Diagnosis not present

## 2021-07-31 DIAGNOSIS — M79672 Pain in left foot: Secondary | ICD-10-CM | POA: Diagnosis not present

## 2021-07-31 DIAGNOSIS — I739 Peripheral vascular disease, unspecified: Secondary | ICD-10-CM | POA: Diagnosis not present

## 2021-08-02 DIAGNOSIS — Z85828 Personal history of other malignant neoplasm of skin: Secondary | ICD-10-CM | POA: Diagnosis not present

## 2021-08-02 DIAGNOSIS — E119 Type 2 diabetes mellitus without complications: Secondary | ICD-10-CM | POA: Diagnosis not present

## 2021-08-02 DIAGNOSIS — I10 Essential (primary) hypertension: Secondary | ICD-10-CM | POA: Diagnosis not present

## 2021-08-02 DIAGNOSIS — H409 Unspecified glaucoma: Secondary | ICD-10-CM | POA: Diagnosis not present

## 2021-08-02 DIAGNOSIS — S72002D Fracture of unspecified part of neck of left femur, subsequent encounter for closed fracture with routine healing: Secondary | ICD-10-CM | POA: Diagnosis not present

## 2021-08-02 DIAGNOSIS — Z9181 History of falling: Secondary | ICD-10-CM | POA: Diagnosis not present

## 2021-08-02 DIAGNOSIS — Z7984 Long term (current) use of oral hypoglycemic drugs: Secondary | ICD-10-CM | POA: Diagnosis not present

## 2021-08-02 DIAGNOSIS — J449 Chronic obstructive pulmonary disease, unspecified: Secondary | ICD-10-CM | POA: Diagnosis not present

## 2021-08-03 DIAGNOSIS — H409 Unspecified glaucoma: Secondary | ICD-10-CM | POA: Diagnosis not present

## 2021-08-03 DIAGNOSIS — Z9181 History of falling: Secondary | ICD-10-CM | POA: Diagnosis not present

## 2021-08-03 DIAGNOSIS — J449 Chronic obstructive pulmonary disease, unspecified: Secondary | ICD-10-CM | POA: Diagnosis not present

## 2021-08-03 DIAGNOSIS — S72002D Fracture of unspecified part of neck of left femur, subsequent encounter for closed fracture with routine healing: Secondary | ICD-10-CM | POA: Diagnosis not present

## 2021-08-03 DIAGNOSIS — Z85828 Personal history of other malignant neoplasm of skin: Secondary | ICD-10-CM | POA: Diagnosis not present

## 2021-08-03 DIAGNOSIS — Z7984 Long term (current) use of oral hypoglycemic drugs: Secondary | ICD-10-CM | POA: Diagnosis not present

## 2021-08-03 DIAGNOSIS — E119 Type 2 diabetes mellitus without complications: Secondary | ICD-10-CM | POA: Diagnosis not present

## 2021-08-03 DIAGNOSIS — I10 Essential (primary) hypertension: Secondary | ICD-10-CM | POA: Diagnosis not present

## 2021-08-09 DIAGNOSIS — Z7984 Long term (current) use of oral hypoglycemic drugs: Secondary | ICD-10-CM | POA: Diagnosis not present

## 2021-08-09 DIAGNOSIS — J449 Chronic obstructive pulmonary disease, unspecified: Secondary | ICD-10-CM | POA: Diagnosis not present

## 2021-08-09 DIAGNOSIS — H409 Unspecified glaucoma: Secondary | ICD-10-CM | POA: Diagnosis not present

## 2021-08-09 DIAGNOSIS — Z85828 Personal history of other malignant neoplasm of skin: Secondary | ICD-10-CM | POA: Diagnosis not present

## 2021-08-09 DIAGNOSIS — I10 Essential (primary) hypertension: Secondary | ICD-10-CM | POA: Diagnosis not present

## 2021-08-09 DIAGNOSIS — E119 Type 2 diabetes mellitus without complications: Secondary | ICD-10-CM | POA: Diagnosis not present

## 2021-08-09 DIAGNOSIS — Z9181 History of falling: Secondary | ICD-10-CM | POA: Diagnosis not present

## 2021-08-09 DIAGNOSIS — S72002D Fracture of unspecified part of neck of left femur, subsequent encounter for closed fracture with routine healing: Secondary | ICD-10-CM | POA: Diagnosis not present

## 2021-08-10 DIAGNOSIS — Z9181 History of falling: Secondary | ICD-10-CM | POA: Diagnosis not present

## 2021-08-10 DIAGNOSIS — I10 Essential (primary) hypertension: Secondary | ICD-10-CM | POA: Diagnosis not present

## 2021-08-10 DIAGNOSIS — E119 Type 2 diabetes mellitus without complications: Secondary | ICD-10-CM | POA: Diagnosis not present

## 2021-08-10 DIAGNOSIS — Z85828 Personal history of other malignant neoplasm of skin: Secondary | ICD-10-CM | POA: Diagnosis not present

## 2021-08-10 DIAGNOSIS — H409 Unspecified glaucoma: Secondary | ICD-10-CM | POA: Diagnosis not present

## 2021-08-10 DIAGNOSIS — Z7984 Long term (current) use of oral hypoglycemic drugs: Secondary | ICD-10-CM | POA: Diagnosis not present

## 2021-08-10 DIAGNOSIS — S72002D Fracture of unspecified part of neck of left femur, subsequent encounter for closed fracture with routine healing: Secondary | ICD-10-CM | POA: Diagnosis not present

## 2021-08-10 DIAGNOSIS — J449 Chronic obstructive pulmonary disease, unspecified: Secondary | ICD-10-CM | POA: Diagnosis not present

## 2021-08-11 DIAGNOSIS — I10 Essential (primary) hypertension: Secondary | ICD-10-CM | POA: Diagnosis not present

## 2021-08-11 DIAGNOSIS — E1142 Type 2 diabetes mellitus with diabetic polyneuropathy: Secondary | ICD-10-CM | POA: Diagnosis not present

## 2021-08-16 DIAGNOSIS — S72002D Fracture of unspecified part of neck of left femur, subsequent encounter for closed fracture with routine healing: Secondary | ICD-10-CM | POA: Diagnosis not present

## 2021-08-16 DIAGNOSIS — Z9181 History of falling: Secondary | ICD-10-CM | POA: Diagnosis not present

## 2021-08-16 DIAGNOSIS — E119 Type 2 diabetes mellitus without complications: Secondary | ICD-10-CM | POA: Diagnosis not present

## 2021-08-16 DIAGNOSIS — I10 Essential (primary) hypertension: Secondary | ICD-10-CM | POA: Diagnosis not present

## 2021-08-16 DIAGNOSIS — J449 Chronic obstructive pulmonary disease, unspecified: Secondary | ICD-10-CM | POA: Diagnosis not present

## 2021-08-16 DIAGNOSIS — H409 Unspecified glaucoma: Secondary | ICD-10-CM | POA: Diagnosis not present

## 2021-08-16 DIAGNOSIS — Z7984 Long term (current) use of oral hypoglycemic drugs: Secondary | ICD-10-CM | POA: Diagnosis not present

## 2021-08-16 DIAGNOSIS — Z85828 Personal history of other malignant neoplasm of skin: Secondary | ICD-10-CM | POA: Diagnosis not present

## 2021-08-22 DIAGNOSIS — S72032D Displaced midcervical fracture of left femur, subsequent encounter for closed fracture with routine healing: Secondary | ICD-10-CM | POA: Diagnosis not present

## 2021-08-23 DIAGNOSIS — S72002D Fracture of unspecified part of neck of left femur, subsequent encounter for closed fracture with routine healing: Secondary | ICD-10-CM | POA: Diagnosis not present

## 2021-08-23 DIAGNOSIS — R2689 Other abnormalities of gait and mobility: Secondary | ICD-10-CM | POA: Diagnosis not present

## 2021-08-23 DIAGNOSIS — J449 Chronic obstructive pulmonary disease, unspecified: Secondary | ICD-10-CM | POA: Diagnosis not present

## 2021-08-23 DIAGNOSIS — Z9181 History of falling: Secondary | ICD-10-CM | POA: Diagnosis not present

## 2021-09-11 DIAGNOSIS — J449 Chronic obstructive pulmonary disease, unspecified: Secondary | ICD-10-CM | POA: Diagnosis not present

## 2021-09-11 DIAGNOSIS — E1142 Type 2 diabetes mellitus with diabetic polyneuropathy: Secondary | ICD-10-CM | POA: Diagnosis not present

## 2021-09-19 DIAGNOSIS — H401123 Primary open-angle glaucoma, left eye, severe stage: Secondary | ICD-10-CM | POA: Diagnosis not present

## 2021-09-19 DIAGNOSIS — H401112 Primary open-angle glaucoma, right eye, moderate stage: Secondary | ICD-10-CM | POA: Diagnosis not present

## 2021-09-23 DIAGNOSIS — S72002D Fracture of unspecified part of neck of left femur, subsequent encounter for closed fracture with routine healing: Secondary | ICD-10-CM | POA: Diagnosis not present

## 2021-09-23 DIAGNOSIS — J449 Chronic obstructive pulmonary disease, unspecified: Secondary | ICD-10-CM | POA: Diagnosis not present

## 2021-09-23 DIAGNOSIS — Z9181 History of falling: Secondary | ICD-10-CM | POA: Diagnosis not present

## 2021-09-23 DIAGNOSIS — R2689 Other abnormalities of gait and mobility: Secondary | ICD-10-CM | POA: Diagnosis not present

## 2021-10-09 DIAGNOSIS — E1140 Type 2 diabetes mellitus with diabetic neuropathy, unspecified: Secondary | ICD-10-CM | POA: Diagnosis not present

## 2021-10-09 DIAGNOSIS — M79671 Pain in right foot: Secondary | ICD-10-CM | POA: Diagnosis not present

## 2021-10-09 DIAGNOSIS — M79672 Pain in left foot: Secondary | ICD-10-CM | POA: Diagnosis not present

## 2021-10-09 DIAGNOSIS — L110 Acquired keratosis follicularis: Secondary | ICD-10-CM | POA: Diagnosis not present

## 2021-10-09 DIAGNOSIS — I739 Peripheral vascular disease, unspecified: Secondary | ICD-10-CM | POA: Diagnosis not present

## 2021-10-11 DIAGNOSIS — I10 Essential (primary) hypertension: Secondary | ICD-10-CM | POA: Diagnosis not present

## 2021-10-11 DIAGNOSIS — E1142 Type 2 diabetes mellitus with diabetic polyneuropathy: Secondary | ICD-10-CM | POA: Diagnosis not present

## 2021-10-23 DIAGNOSIS — R2689 Other abnormalities of gait and mobility: Secondary | ICD-10-CM | POA: Diagnosis not present

## 2021-10-23 DIAGNOSIS — S72002D Fracture of unspecified part of neck of left femur, subsequent encounter for closed fracture with routine healing: Secondary | ICD-10-CM | POA: Diagnosis not present

## 2021-10-23 DIAGNOSIS — J449 Chronic obstructive pulmonary disease, unspecified: Secondary | ICD-10-CM | POA: Diagnosis not present

## 2021-10-23 DIAGNOSIS — Z9181 History of falling: Secondary | ICD-10-CM | POA: Diagnosis not present

## 2021-11-11 DIAGNOSIS — I10 Essential (primary) hypertension: Secondary | ICD-10-CM | POA: Diagnosis not present

## 2021-11-11 DIAGNOSIS — E1142 Type 2 diabetes mellitus with diabetic polyneuropathy: Secondary | ICD-10-CM | POA: Diagnosis not present

## 2021-11-22 DIAGNOSIS — I10 Essential (primary) hypertension: Secondary | ICD-10-CM | POA: Diagnosis not present

## 2021-11-22 DIAGNOSIS — Z1389 Encounter for screening for other disorder: Secondary | ICD-10-CM | POA: Diagnosis not present

## 2021-11-22 DIAGNOSIS — J449 Chronic obstructive pulmonary disease, unspecified: Secondary | ICD-10-CM | POA: Diagnosis not present

## 2021-11-22 DIAGNOSIS — E1142 Type 2 diabetes mellitus with diabetic polyneuropathy: Secondary | ICD-10-CM | POA: Diagnosis not present

## 2021-11-22 DIAGNOSIS — Z0001 Encounter for general adult medical examination with abnormal findings: Secondary | ICD-10-CM | POA: Diagnosis not present

## 2021-11-23 DIAGNOSIS — R2689 Other abnormalities of gait and mobility: Secondary | ICD-10-CM | POA: Diagnosis not present

## 2021-11-23 DIAGNOSIS — J449 Chronic obstructive pulmonary disease, unspecified: Secondary | ICD-10-CM | POA: Diagnosis not present

## 2021-11-23 DIAGNOSIS — Z9181 History of falling: Secondary | ICD-10-CM | POA: Diagnosis not present

## 2021-11-23 DIAGNOSIS — S72002D Fracture of unspecified part of neck of left femur, subsequent encounter for closed fracture with routine healing: Secondary | ICD-10-CM | POA: Diagnosis not present

## 2021-12-18 DIAGNOSIS — M79672 Pain in left foot: Secondary | ICD-10-CM | POA: Diagnosis not present

## 2021-12-18 DIAGNOSIS — L110 Acquired keratosis follicularis: Secondary | ICD-10-CM | POA: Diagnosis not present

## 2021-12-18 DIAGNOSIS — E1140 Type 2 diabetes mellitus with diabetic neuropathy, unspecified: Secondary | ICD-10-CM | POA: Diagnosis not present

## 2021-12-18 DIAGNOSIS — I739 Peripheral vascular disease, unspecified: Secondary | ICD-10-CM | POA: Diagnosis not present

## 2021-12-18 DIAGNOSIS — M79671 Pain in right foot: Secondary | ICD-10-CM | POA: Diagnosis not present

## 2021-12-23 DIAGNOSIS — I10 Essential (primary) hypertension: Secondary | ICD-10-CM | POA: Diagnosis not present

## 2021-12-23 DIAGNOSIS — S7292XD Unspecified fracture of left femur, subsequent encounter for closed fracture with routine healing: Secondary | ICD-10-CM | POA: Diagnosis not present

## 2021-12-24 DIAGNOSIS — Z9181 History of falling: Secondary | ICD-10-CM | POA: Diagnosis not present

## 2021-12-24 DIAGNOSIS — J449 Chronic obstructive pulmonary disease, unspecified: Secondary | ICD-10-CM | POA: Diagnosis not present

## 2021-12-24 DIAGNOSIS — S72002D Fracture of unspecified part of neck of left femur, subsequent encounter for closed fracture with routine healing: Secondary | ICD-10-CM | POA: Diagnosis not present

## 2021-12-24 DIAGNOSIS — R2689 Other abnormalities of gait and mobility: Secondary | ICD-10-CM | POA: Diagnosis not present

## 2021-12-26 DIAGNOSIS — H401112 Primary open-angle glaucoma, right eye, moderate stage: Secondary | ICD-10-CM | POA: Diagnosis not present

## 2021-12-26 DIAGNOSIS — H401123 Primary open-angle glaucoma, left eye, severe stage: Secondary | ICD-10-CM | POA: Diagnosis not present

## 2022-01-15 ENCOUNTER — Encounter: Payer: Self-pay | Admitting: Urology

## 2022-01-15 ENCOUNTER — Other Ambulatory Visit: Payer: Self-pay

## 2022-01-15 ENCOUNTER — Ambulatory Visit: Payer: Medicare Other | Admitting: Urology

## 2022-01-15 DIAGNOSIS — R310 Gross hematuria: Secondary | ICD-10-CM | POA: Diagnosis not present

## 2022-01-15 DIAGNOSIS — R351 Nocturia: Secondary | ICD-10-CM | POA: Diagnosis not present

## 2022-01-15 LAB — URINALYSIS, ROUTINE W REFLEX MICROSCOPIC
Bilirubin, UA: NEGATIVE
Glucose, UA: NEGATIVE
Ketones, UA: NEGATIVE
Leukocytes,UA: NEGATIVE
Nitrite, UA: NEGATIVE
Protein,UA: NEGATIVE
RBC, UA: NEGATIVE
Specific Gravity, UA: 1.02 (ref 1.005–1.030)
Urobilinogen, Ur: 0.2 mg/dL (ref 0.2–1.0)
pH, UA: 6.5 (ref 5.0–7.5)

## 2022-01-15 MED ORDER — FINASTERIDE 5 MG PO TABS: 5.0000 mg | ORAL_TABLET | Freq: Every day | ORAL | 3 refills | Status: DC

## 2022-01-15 MED ORDER — ALFUZOSIN HCL ER 10 MG PO TB24: 10.0000 mg | ORAL_TABLET | Freq: Every day | ORAL | 11 refills | Status: DC

## 2022-01-15 NOTE — Patient Instructions (Signed)

## 2022-01-15 NOTE — Progress Notes (Signed)
01/15/2022 10:42 AM   Hayden Green [birth year] [id number]  Referring provider: Carrolyn Meiers, MD [place],  [place] [address]  Followup gross hematuria and nocturia   HPI: Hayden Green is a 88yo here for followup for gross hematuria. No gross hematuria since last visit. He remains on finasteride '5mg'$  daily. IPSS 12 QOl 2. He has bothersome nocturia 4-5x. Urine stream strong. No daytime urinary frequency. No other complaints today.    PMH: Past Medical History:  Diagnosis Date   Anemia, unspecified 05/24/2021   Arthritis    COPD (chronic obstructive pulmonary disease) (HCC)    Diabetes mellitus without complication (HCC)    GERD (gastroesophageal reflux disease)    Heartburn    History of renal cell carcinoma 05/24/2021   Hypertension    Pneumonia    Renal cell carcinoma (HCC)     Surgical History: Past Surgical History:  Procedure Laterality Date   CHOLECYSTECTOMY     ESOPHAGOGASTRODUODENOSCOPY N/A 08/07/2017   Dr. 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 small hiatal hernia, duodenum normal   INTRAMEDULLARY (IM) NAIL INTERTROCHANTERIC Left 05/25/2021   Procedure: INTRAMEDULLARY (IM) NAIL INTERTROCHANTRIC;  Surgeon: Rod Can, MD;  Location: [facility];  Service: Orthopedics;  Laterality: Left;   MALONEY DILATION N/A 08/07/2017   Procedure: Venia Minks DILATION;  Surgeon: Daneil Dolin, MD;  [facility];  Service: Endoscopy;  Laterality: N/A;   right nephrectomy      Home Medications:  Allergies as of 01/15/2022       Reactions   Reglan [metoclopramide] Swelling, Other (See Comments)   Caused oral swelling-states that this medication was taken once or twice in the past and the reaction was the same each time        Medication List        Accurate as of January 15, 2022 10:42 AM. If you have any questions, ask  your nurse or doctor.          Accu-Chek FastClix Lancets Misc   Accu-Chek Guide test strip Generic drug: glucose blood   albuterol 108 (90 Base) MCG/ACT inhaler Commonly known as: VENTOLIN HFA Inhale 2 puffs into the lungs every 6 (six) hours as needed for wheezing or shortness of breath.   finasteride 5 MG tablet Commonly known as: PROSCAR Take 1 tablet (5 mg total) by mouth daily.   HYDROcodone-acetaminophen 5-325 MG tablet Commonly known as: NORCO/VICODIN Take 1-2 tablets by mouth every 4 (four) hours as needed for moderate pain (pain score 4-6).   ketorolac 0.5 % ophthalmic solution Commonly known as: ACULAR Place 1 drop into the right eye 4 (four) times daily.   lisinopril-hydrochlorothiazide 20-12.5 MG tablet Commonly known as: ZESTORETIC Take 1 tablet by mouth daily.   Lumigan 0.01 % Soln Generic drug: bimatoprost Place 1 drop into both eyes at bedtime.   meclizine 25 MG tablet Commonly known as: ANTIVERT Take 25 mg by mouth 4 (four) times daily as needed for dizziness.   metFORMIN 500 MG tablet Commonly known as: Glucophage Take 1 tablet (500 mg total) by mouth 2 (two) times daily with a meal.   methocarbamol 500 MG tablet Commonly known as: ROBAXIN Take 1 tablet (500 mg total) by mouth every 8 (eight) hours as needed for muscle spasms.   multivitamin with minerals Tabs tablet Take 1 tablet by mouth daily.   naproxen sodium 220 MG tablet  Commonly known as: ALEVE Take 220 mg by mouth daily as needed (pain).   omeprazole 20 MG capsule Commonly known as: PRILOSEC Take 20 mg by mouth daily as needed (for acid reflux).   polyethylene glycol 17 g packet Commonly known as: MIRALAX / GLYCOLAX Take 17 g by mouth 2 (two) times daily.   senna-docusate 8.6-50 MG tablet Commonly known as: Senokot-S Take 2 tablets by mouth 2 (two) times daily.   Simbrinza 1-0.2 % Susp Generic drug: Brinzolamide-Brimonidine Place 1 drop into both eyes 2 (two) times  daily.   Vyzulta 0.024 % Soln Generic drug: Latanoprostene Bunod Place 1 drop into both eyes at bedtime.        Allergies:  Allergies  Allergen Reactions   Reglan [Metoclopramide] Swelling and Other (See Comments)    Caused oral swelling-states that this medication was taken once or twice in the past and the reaction was the same each time    Family History: Family History  Problem Relation Age of Onset   Colon cancer Neg Hx    Colon polyps Neg Hx     Social History:  reports that he quit smoking about 16 years ago. He has quit using smokeless tobacco. He reports that he does not drink alcohol and does not use drugs.  ROS: All other review of systems were reviewed and are negative except what is noted above in HPI  Physical Exam: There were no vitals taken for this visit.  Constitutional:  Alert and oriented, No acute distress. HEENT: [place] AT, moist mucus membranes.  Trachea midline, no masses. Cardiovascular: No clubbing, cyanosis, or edema. Respiratory: Normal respiratory effort, no increased work of breathing. GI: Abdomen is soft, nontender, nondistended, no abdominal masses GU: No CVA tenderness.  Lymph: No cervical or inguinal lymphadenopathy. Skin: No rashes, bruises or suspicious lesions. Neurologic: Grossly intact, no focal deficits, moving all 4 extremities. Psychiatric: Normal mood and affect.  Laboratory Data: Lab Results  Component Value Date   WBC 6.0 06/28/2021   HGB 10.2 (L) 06/28/2021   HCT 33.3 (L) 06/28/2021   MCV 92.2 06/28/2021   PLT 186 06/28/2021    Lab Results  Component Value Date   CREATININE 1.21 06/28/2021    No results found for: PSA  No results found for: TESTOSTERONE  Lab Results  Component Value Date   HGBA1C 5.3 06/28/2021    Urinalysis    Component Value Date/Time   COLORURINE RED (A) 07/02/2020 0957   APPEARANCEUR Negative 01/11/2021 1016   LABSPEC 1.015 07/02/2020 0957   PHURINE 7.0 07/02/2020 0957   GLUCOSEU  Negative 01/11/2021 1016   HGBUR (A) 07/02/2020 0957    TEST NOT REPORTED DUE TO COLOR INTERFERENCE OF URINE PIGMENT   BILIRUBINUR Negative 01/11/2021 1016   KETONESUR (A) 07/02/2020 0957    TEST NOT REPORTED DUE TO COLOR INTERFERENCE OF URINE PIGMENT   PROTEINUR Negative 01/11/2021 1016   PROTEINUR (A) 07/02/2020 0957    TEST NOT REPORTED DUE TO COLOR INTERFERENCE OF URINE PIGMENT   UROBILINOGEN 0.2 09/08/2011 0730   NITRITE Negative 01/11/2021 1016   NITRITE (A) 07/02/2020 0957    TEST NOT REPORTED DUE TO COLOR INTERFERENCE OF URINE PIGMENT   LEUKOCYTESUR Negative 01/11/2021 1016   LEUKOCYTESUR (A) 07/02/2020 0957    TEST NOT REPORTED DUE TO COLOR INTERFERENCE OF URINE PIGMENT    Lab Results  Component Value Date   LABMICR Comment 01/11/2021   WBCUA None seen 07/06/2020   LABEPIT None seen 07/06/2020   BACTERIA  None seen 07/06/2020    Pertinent Imaging:  Results for orders placed in visit on 06/17/02  DG Abd 1 View  Narrative FINDINGS CLINICAL DATA:  ABDOMINAL PAIN. ABDOMEN 1 VIEW NO URINARY TRACT CALCIFICATION.  NORMAL GAS PATTERN WITHOUT SIGNS OF BOWEL DILATATION OR OBSTRUCTION.  SKELETAL STRUCTUES UNREMARKABLE.  SI JOINTS PRESERVED. IMPRESSION NO ACUTE ABNORMALITIES.  No results found for this or any previous visit.  No results found for this or any previous visit.  No results found for this or any previous visit.  No results found for this or any previous visit.  No results found for this or any previous visit.  No results found for this or any previous visit.  Results for orders placed during the hospital encounter of 07/02/20  CT Renal Stone Study  Narrative CLINICAL DATA:  Hematuria noticed this morning, unknown cause, denies pain  EXAM: CT ABDOMEN AND PELVIS WITHOUT CONTRAST  TECHNIQUE: Multidetector CT imaging of the abdomen and pelvis was performed following the standard protocol without IV contrast. Oral contrast not administered for  this indication. Sagittal and coronal MPR images reconstructed from axial data set.  COMPARISON:  11/05/2007 CT abdomen, CT abdomen and pelvis 04/14/2004 intermediate to high attenuation mass at posteroinferior bladder  FINDINGS: Lower chest: Peribronchial thickening and volume loss in the lower lobes. Calcified granuloma RIGHT lower lobe.  Hepatobiliary: Small cyst lateral segment LEFT lobe liver unchanged. Calcified granuloma anterior liver. Gallbladder surgically absent. No additional hepatic abnormalities.  Pancreas: Normal appearance  Spleen: Normal appearance  Adrenals/Urinary Tract: Indeterminate LEFT adrenal nodule 2.5 x 2.2 cm image 17, unchanged. Probable small cyst LEFT kidney 10 mm diameter slightly increased. Questionable small cyst 12 mm mid LEFT kidney image 24. Kidneys and ureters otherwise unremarkable. Intermediate attenuation "mass" at inferior bladder posteriorly, 4.8 x 3.5 x 2.0 cm, question clot versus tumor, not felt to be related to prostate gland. No urinary tract calcification or dilatation seen.  Stomach/Bowel: Normal appendix. Mild sigmoid diverticulosis without evidence of diverticulitis. Gastric wall appears thickened though this may be an artifact related to collapsed state. Remaining bowel loops unremarkable.  Vascular/Lymphatic: Minimal atherosclerotic calcification aorta and iliac arteries. Aorta normal caliber. No adenopathy.  Reproductive: Minimal prostatic enlargement.  Other: No free air or free fluid.  No acute inflammatory process.  Musculoskeletal: Unremarkable  IMPRESSION: Intermediate attenuation "mass" at inferior bladder posteriorly 4.8 x 3.5 x 2.0 cm, question clot versus tumor, not felt to be related to prostate gland; recommend correlation with cystoscopy.  Stable LEFT adrenal nodule 2.5 x 2.2 cm.  Probable small renal cysts.  Mild sigmoid diverticulosis without evidence of diverticulitis.  Bronchitic changes and  volume loss in the lower lobes.  Aortic Atherosclerosis (ICD10-I70.0).   Electronically Signed By: Lavonia Dana M.D. On: 07/02/2020 12:24   Assessment & Plan:    1. Gross hematuria -continue finasteride '5mg'$  - Urinalysis, Routine w reflex microscopic  2. Nocturia -We will start uroxatral '10mg'$  qhs   No follow-ups on file.  Nicolette Bang, MD  [facility] [place]

## 2022-01-20 DIAGNOSIS — I10 Essential (primary) hypertension: Secondary | ICD-10-CM | POA: Diagnosis not present

## 2022-01-20 DIAGNOSIS — E1165 Type 2 diabetes mellitus with hyperglycemia: Secondary | ICD-10-CM | POA: Diagnosis not present

## 2022-01-21 DIAGNOSIS — J449 Chronic obstructive pulmonary disease, unspecified: Secondary | ICD-10-CM | POA: Diagnosis not present

## 2022-01-21 DIAGNOSIS — S72002D Fracture of unspecified part of neck of left femur, subsequent encounter for closed fracture with routine healing: Secondary | ICD-10-CM | POA: Diagnosis not present

## 2022-01-21 DIAGNOSIS — R2689 Other abnormalities of gait and mobility: Secondary | ICD-10-CM | POA: Diagnosis not present

## 2022-02-12 DIAGNOSIS — J449 Chronic obstructive pulmonary disease, unspecified: Secondary | ICD-10-CM | POA: Diagnosis not present

## 2022-02-12 DIAGNOSIS — E118 Type 2 diabetes mellitus with unspecified complications: Secondary | ICD-10-CM | POA: Diagnosis not present

## 2022-02-12 DIAGNOSIS — I10 Essential (primary) hypertension: Secondary | ICD-10-CM | POA: Diagnosis not present

## 2022-02-21 DIAGNOSIS — R2689 Other abnormalities of gait and mobility: Secondary | ICD-10-CM | POA: Diagnosis not present

## 2022-02-21 DIAGNOSIS — J449 Chronic obstructive pulmonary disease, unspecified: Secondary | ICD-10-CM | POA: Diagnosis not present

## 2022-02-21 DIAGNOSIS — S72002D Fracture of unspecified part of neck of left femur, subsequent encounter for closed fracture with routine healing: Secondary | ICD-10-CM | POA: Diagnosis not present

## 2022-02-26 DIAGNOSIS — I739 Peripheral vascular disease, unspecified: Secondary | ICD-10-CM | POA: Diagnosis not present

## 2022-02-26 DIAGNOSIS — L110 Acquired keratosis follicularis: Secondary | ICD-10-CM | POA: Diagnosis not present

## 2022-02-26 DIAGNOSIS — M79672 Pain in left foot: Secondary | ICD-10-CM | POA: Diagnosis not present

## 2022-02-26 DIAGNOSIS — M79671 Pain in right foot: Secondary | ICD-10-CM | POA: Diagnosis not present

## 2022-02-26 DIAGNOSIS — E1140 Type 2 diabetes mellitus with diabetic neuropathy, unspecified: Secondary | ICD-10-CM | POA: Diagnosis not present

## 2022-02-27 DIAGNOSIS — H401112 Primary open-angle glaucoma, right eye, moderate stage: Secondary | ICD-10-CM | POA: Diagnosis not present

## 2022-02-27 DIAGNOSIS — H401123 Primary open-angle glaucoma, left eye, severe stage: Secondary | ICD-10-CM | POA: Diagnosis not present

## 2022-03-14 DIAGNOSIS — I10 Essential (primary) hypertension: Secondary | ICD-10-CM | POA: Diagnosis not present

## 2022-03-14 DIAGNOSIS — E1142 Type 2 diabetes mellitus with diabetic polyneuropathy: Secondary | ICD-10-CM | POA: Diagnosis not present

## 2022-03-23 DIAGNOSIS — J449 Chronic obstructive pulmonary disease, unspecified: Secondary | ICD-10-CM | POA: Diagnosis not present

## 2022-03-23 DIAGNOSIS — R2689 Other abnormalities of gait and mobility: Secondary | ICD-10-CM | POA: Diagnosis not present

## 2022-03-23 DIAGNOSIS — S72002D Fracture of unspecified part of neck of left femur, subsequent encounter for closed fracture with routine healing: Secondary | ICD-10-CM | POA: Diagnosis not present

## 2022-04-18 DIAGNOSIS — I10 Essential (primary) hypertension: Secondary | ICD-10-CM | POA: Diagnosis not present

## 2022-04-18 DIAGNOSIS — E119 Type 2 diabetes mellitus without complications: Secondary | ICD-10-CM | POA: Diagnosis not present

## 2022-04-18 DIAGNOSIS — J449 Chronic obstructive pulmonary disease, unspecified: Secondary | ICD-10-CM | POA: Diagnosis not present

## 2022-04-23 DIAGNOSIS — J449 Chronic obstructive pulmonary disease, unspecified: Secondary | ICD-10-CM | POA: Diagnosis not present

## 2022-04-23 DIAGNOSIS — S72002D Fracture of unspecified part of neck of left femur, subsequent encounter for closed fracture with routine healing: Secondary | ICD-10-CM | POA: Diagnosis not present

## 2022-04-23 DIAGNOSIS — R2689 Other abnormalities of gait and mobility: Secondary | ICD-10-CM | POA: Diagnosis not present

## 2022-05-10 DIAGNOSIS — K219 Gastro-esophageal reflux disease without esophagitis: Secondary | ICD-10-CM | POA: Diagnosis not present

## 2022-05-10 DIAGNOSIS — J449 Chronic obstructive pulmonary disease, unspecified: Secondary | ICD-10-CM | POA: Diagnosis not present

## 2022-05-10 DIAGNOSIS — E1142 Type 2 diabetes mellitus with diabetic polyneuropathy: Secondary | ICD-10-CM | POA: Diagnosis not present

## 2022-05-10 DIAGNOSIS — I10 Essential (primary) hypertension: Secondary | ICD-10-CM | POA: Diagnosis not present

## 2022-05-21 DIAGNOSIS — M79672 Pain in left foot: Secondary | ICD-10-CM | POA: Diagnosis not present

## 2022-05-21 DIAGNOSIS — E1140 Type 2 diabetes mellitus with diabetic neuropathy, unspecified: Secondary | ICD-10-CM | POA: Diagnosis not present

## 2022-05-21 DIAGNOSIS — M79671 Pain in right foot: Secondary | ICD-10-CM | POA: Diagnosis not present

## 2022-05-21 DIAGNOSIS — L110 Acquired keratosis follicularis: Secondary | ICD-10-CM | POA: Diagnosis not present

## 2022-05-21 DIAGNOSIS — I739 Peripheral vascular disease, unspecified: Secondary | ICD-10-CM | POA: Diagnosis not present

## 2022-05-25 ENCOUNTER — Other Ambulatory Visit: Payer: Self-pay | Admitting: Urology

## 2022-05-29 DIAGNOSIS — H401123 Primary open-angle glaucoma, left eye, severe stage: Secondary | ICD-10-CM | POA: Diagnosis not present

## 2022-05-29 DIAGNOSIS — H47233 Glaucomatous optic atrophy, bilateral: Secondary | ICD-10-CM | POA: Diagnosis not present

## 2022-05-29 DIAGNOSIS — H401112 Primary open-angle glaucoma, right eye, moderate stage: Secondary | ICD-10-CM | POA: Diagnosis not present

## 2022-06-10 DIAGNOSIS — I10 Essential (primary) hypertension: Secondary | ICD-10-CM | POA: Diagnosis not present

## 2022-06-10 DIAGNOSIS — J449 Chronic obstructive pulmonary disease, unspecified: Secondary | ICD-10-CM | POA: Diagnosis not present

## 2022-06-25 DIAGNOSIS — J441 Chronic obstructive pulmonary disease with (acute) exacerbation: Secondary | ICD-10-CM | POA: Diagnosis not present

## 2022-06-25 DIAGNOSIS — E1142 Type 2 diabetes mellitus with diabetic polyneuropathy: Secondary | ICD-10-CM | POA: Diagnosis not present

## 2022-06-25 DIAGNOSIS — I10 Essential (primary) hypertension: Secondary | ICD-10-CM | POA: Diagnosis not present

## 2022-07-26 DIAGNOSIS — E1142 Type 2 diabetes mellitus with diabetic polyneuropathy: Secondary | ICD-10-CM | POA: Diagnosis not present

## 2022-07-26 DIAGNOSIS — I10 Essential (primary) hypertension: Secondary | ICD-10-CM | POA: Diagnosis not present

## 2022-07-30 DIAGNOSIS — M79672 Pain in left foot: Secondary | ICD-10-CM | POA: Diagnosis not present

## 2022-07-30 DIAGNOSIS — M79671 Pain in right foot: Secondary | ICD-10-CM | POA: Diagnosis not present

## 2022-07-30 DIAGNOSIS — L110 Acquired keratosis follicularis: Secondary | ICD-10-CM | POA: Diagnosis not present

## 2022-07-30 DIAGNOSIS — E1140 Type 2 diabetes mellitus with diabetic neuropathy, unspecified: Secondary | ICD-10-CM | POA: Diagnosis not present

## 2022-07-30 DIAGNOSIS — I739 Peripheral vascular disease, unspecified: Secondary | ICD-10-CM | POA: Diagnosis not present

## 2022-08-20 DIAGNOSIS — Z23 Encounter for immunization: Secondary | ICD-10-CM | POA: Diagnosis not present

## 2022-08-25 DIAGNOSIS — I10 Essential (primary) hypertension: Secondary | ICD-10-CM | POA: Diagnosis not present

## 2022-08-25 DIAGNOSIS — E1142 Type 2 diabetes mellitus with diabetic polyneuropathy: Secondary | ICD-10-CM | POA: Diagnosis not present

## 2022-09-24 DIAGNOSIS — E118 Type 2 diabetes mellitus with unspecified complications: Secondary | ICD-10-CM | POA: Diagnosis not present

## 2022-09-24 DIAGNOSIS — I10 Essential (primary) hypertension: Secondary | ICD-10-CM | POA: Diagnosis not present

## 2022-09-24 DIAGNOSIS — J441 Chronic obstructive pulmonary disease with (acute) exacerbation: Secondary | ICD-10-CM | POA: Diagnosis not present

## 2022-10-09 DIAGNOSIS — H47233 Glaucomatous optic atrophy, bilateral: Secondary | ICD-10-CM | POA: Diagnosis not present

## 2022-10-09 DIAGNOSIS — H401112 Primary open-angle glaucoma, right eye, moderate stage: Secondary | ICD-10-CM | POA: Diagnosis not present

## 2022-10-09 DIAGNOSIS — H401123 Primary open-angle glaucoma, left eye, severe stage: Secondary | ICD-10-CM | POA: Diagnosis not present

## 2022-10-15 DIAGNOSIS — M79672 Pain in left foot: Secondary | ICD-10-CM | POA: Diagnosis not present

## 2022-10-15 DIAGNOSIS — E1140 Type 2 diabetes mellitus with diabetic neuropathy, unspecified: Secondary | ICD-10-CM | POA: Diagnosis not present

## 2022-10-15 DIAGNOSIS — I739 Peripheral vascular disease, unspecified: Secondary | ICD-10-CM | POA: Diagnosis not present

## 2022-10-15 DIAGNOSIS — L110 Acquired keratosis follicularis: Secondary | ICD-10-CM | POA: Diagnosis not present

## 2022-10-15 DIAGNOSIS — M79671 Pain in right foot: Secondary | ICD-10-CM | POA: Diagnosis not present

## 2022-10-24 DIAGNOSIS — J449 Chronic obstructive pulmonary disease, unspecified: Secondary | ICD-10-CM | POA: Diagnosis not present

## 2022-10-24 DIAGNOSIS — I10 Essential (primary) hypertension: Secondary | ICD-10-CM | POA: Diagnosis not present

## 2022-11-12 DIAGNOSIS — I10 Essential (primary) hypertension: Secondary | ICD-10-CM | POA: Diagnosis not present

## 2022-11-12 DIAGNOSIS — E1142 Type 2 diabetes mellitus with diabetic polyneuropathy: Secondary | ICD-10-CM | POA: Diagnosis not present

## 2022-11-12 DIAGNOSIS — Z0001 Encounter for general adult medical examination with abnormal findings: Secondary | ICD-10-CM | POA: Diagnosis not present

## 2022-11-12 DIAGNOSIS — E118 Type 2 diabetes mellitus with unspecified complications: Secondary | ICD-10-CM | POA: Diagnosis not present

## 2022-11-12 DIAGNOSIS — Z1389 Encounter for screening for other disorder: Secondary | ICD-10-CM | POA: Diagnosis not present

## 2022-11-12 DIAGNOSIS — H4010X0 Unspecified open-angle glaucoma, stage unspecified: Secondary | ICD-10-CM | POA: Diagnosis not present

## 2022-11-12 DIAGNOSIS — M170 Bilateral primary osteoarthritis of knee: Secondary | ICD-10-CM | POA: Diagnosis not present

## 2022-11-12 DIAGNOSIS — J449 Chronic obstructive pulmonary disease, unspecified: Secondary | ICD-10-CM | POA: Diagnosis not present

## 2022-11-12 DIAGNOSIS — K219 Gastro-esophageal reflux disease without esophagitis: Secondary | ICD-10-CM | POA: Diagnosis not present

## 2022-11-12 IMAGING — DX DG HIP (WITH OR WITHOUT PELVIS) 2-3V*L*
3 series · 4 of 4 positions shown · non-contrast
Comparison: CT 07/02/2020

CLINICAL DATA: Left hip pain post fall off ladder

EXAM:
DG HIP (WITH OR WITHOUT PELVIS) 2-3V LEFT

[Series 1: pelvis ap · 0.13mm/px · 2 of 2 slices shown]
[im 1/2]
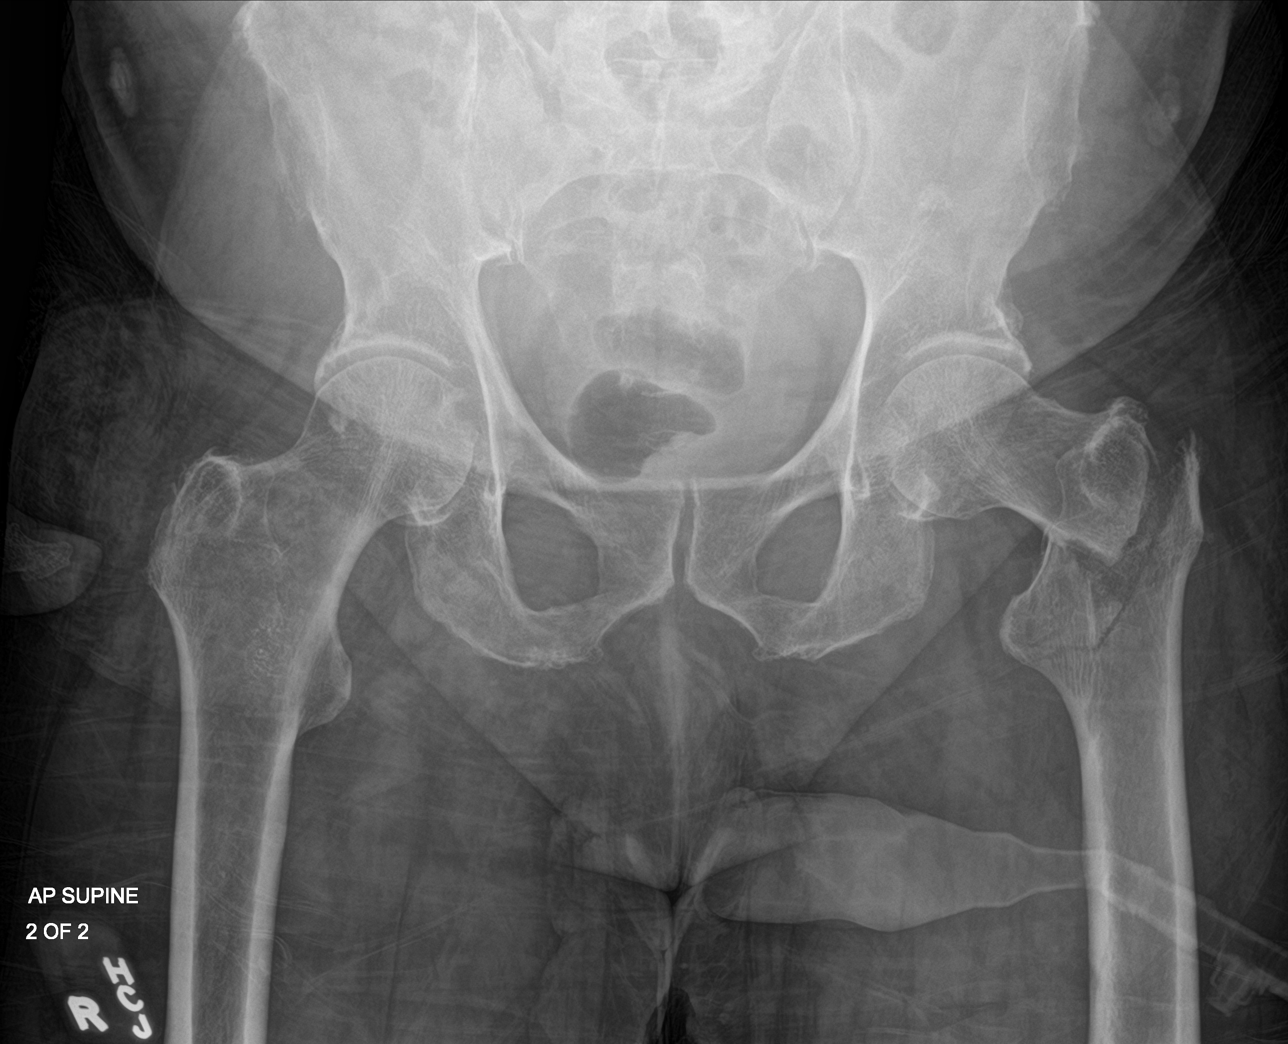
[im 2/2]
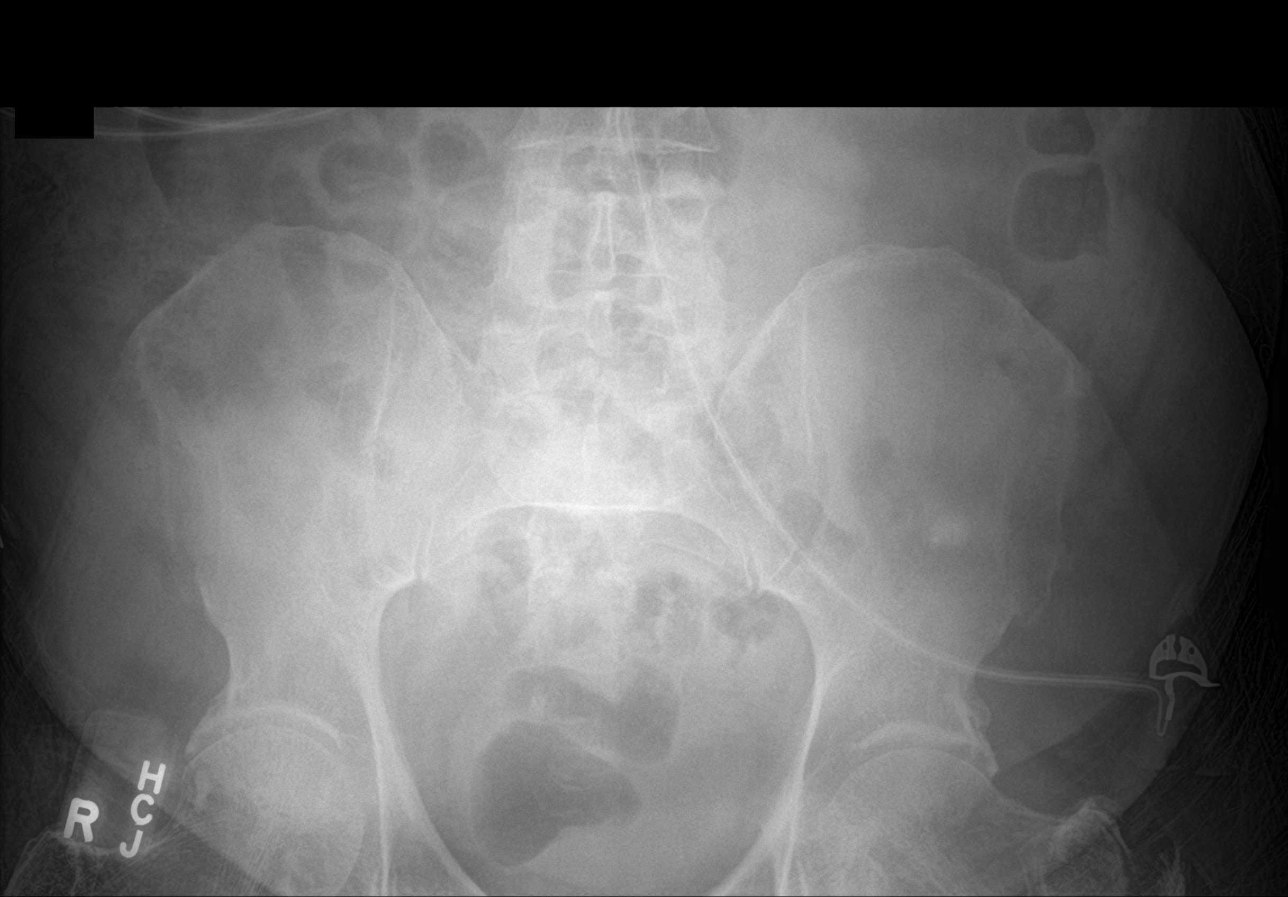

[hip ap]
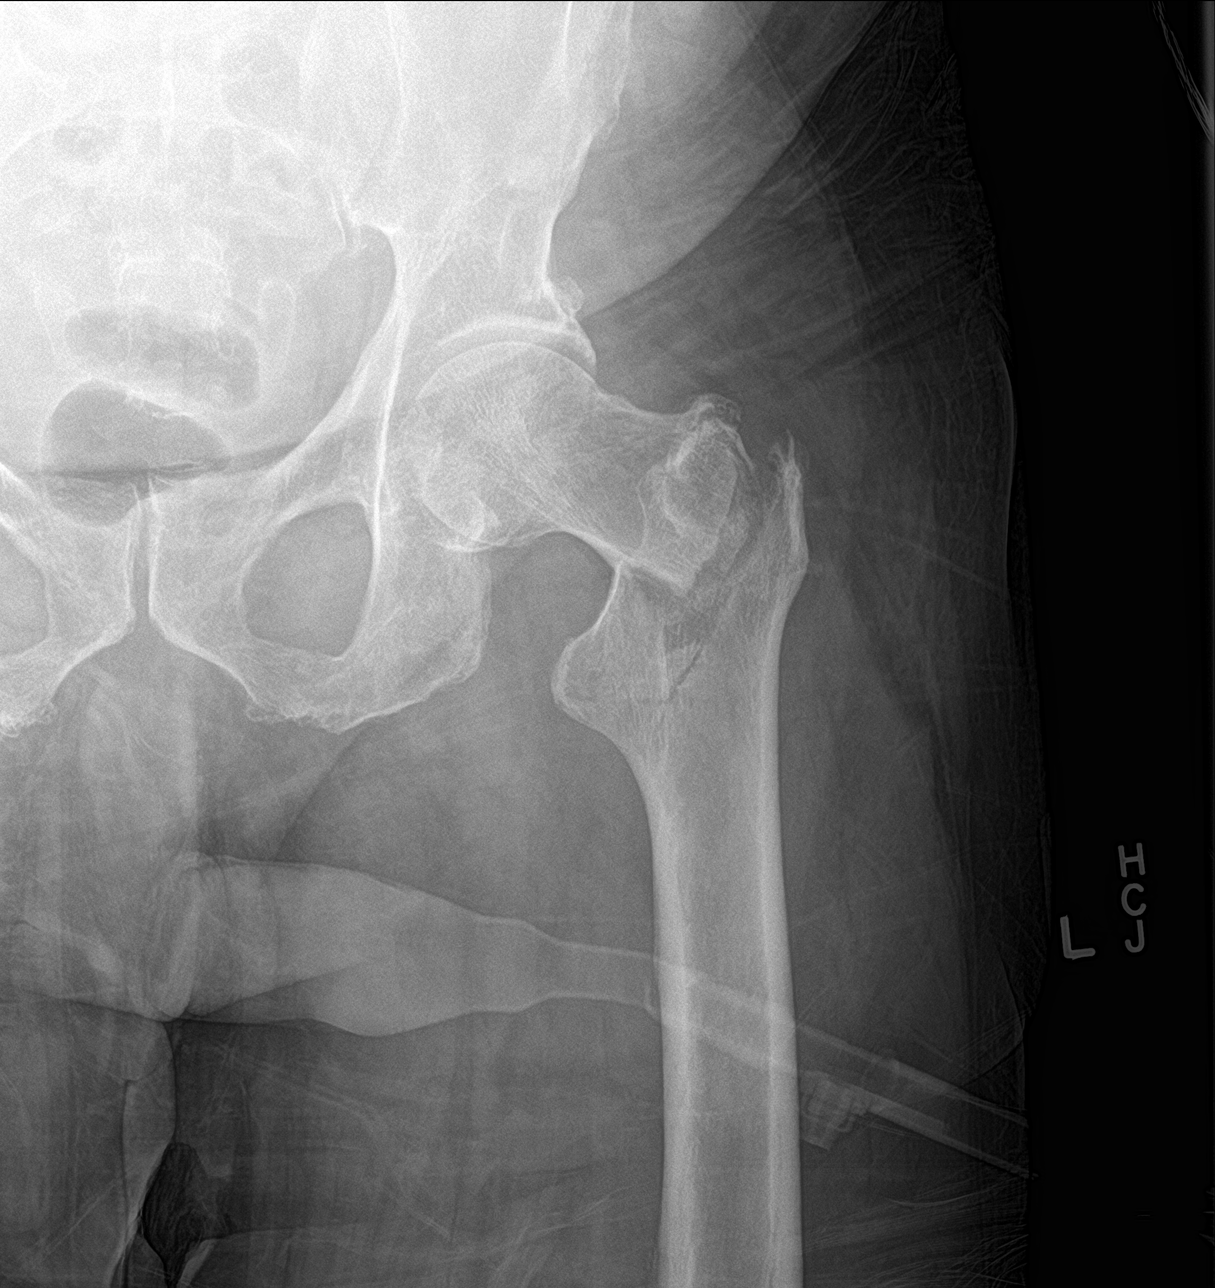

[hip lat xtable left]
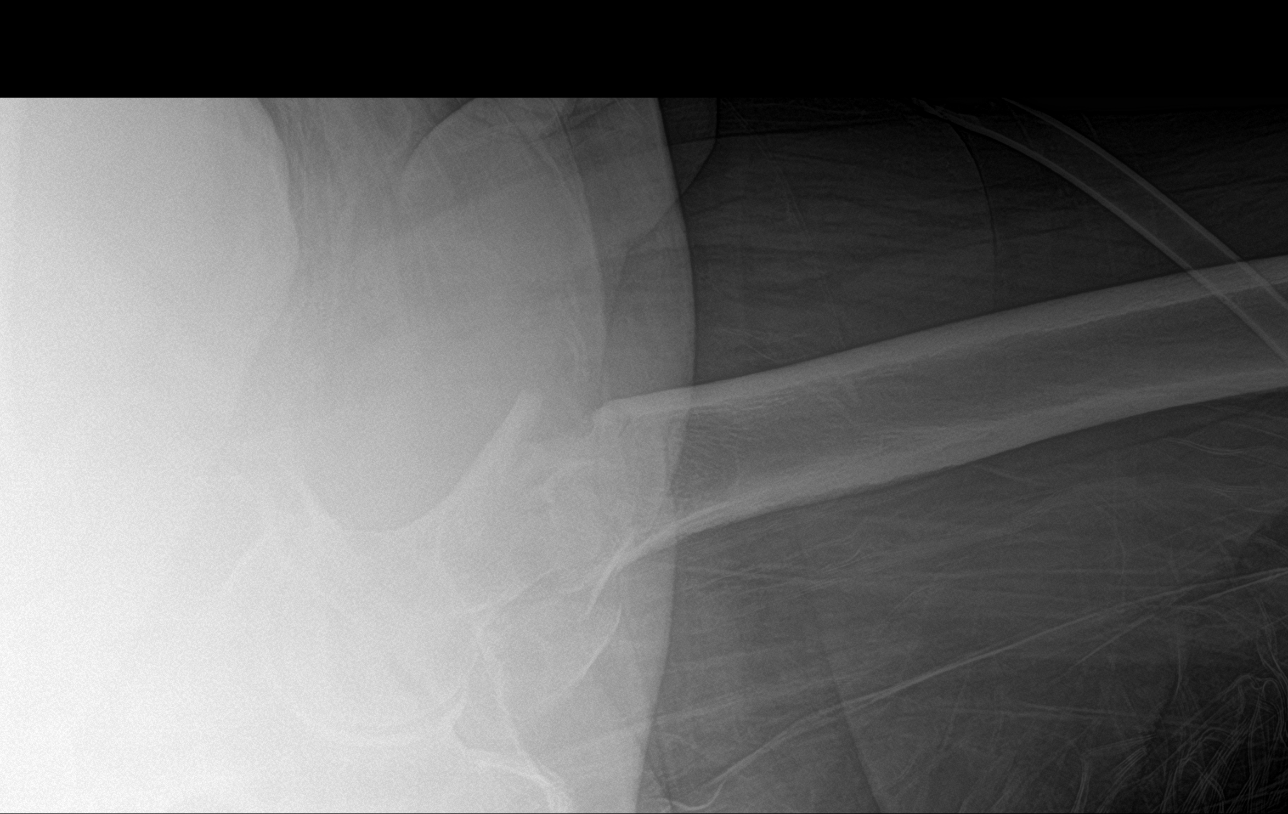

[4 of 4 positions shown; findings below may reference images not displayed]

FINDINGS: Comminuted left intertrochanteric fracture with mild varus
deformity. No dislocation. Pelvic ring appears intact.
IMPRESSION: Comminuted left IT femur fracture

## 2022-11-12 IMAGING — DX DG CHEST 1V
1 series · 1 of 1 positions shown · non-contrast
Comparison: 09/24/2016

CLINICAL DATA: Pt had a fall earlier today from a ladder. Hx of
COPD, HTN, pneumonia,and former smoker. Covid test pending.

EXAM:
CHEST  1 VIEW

[chest ap]
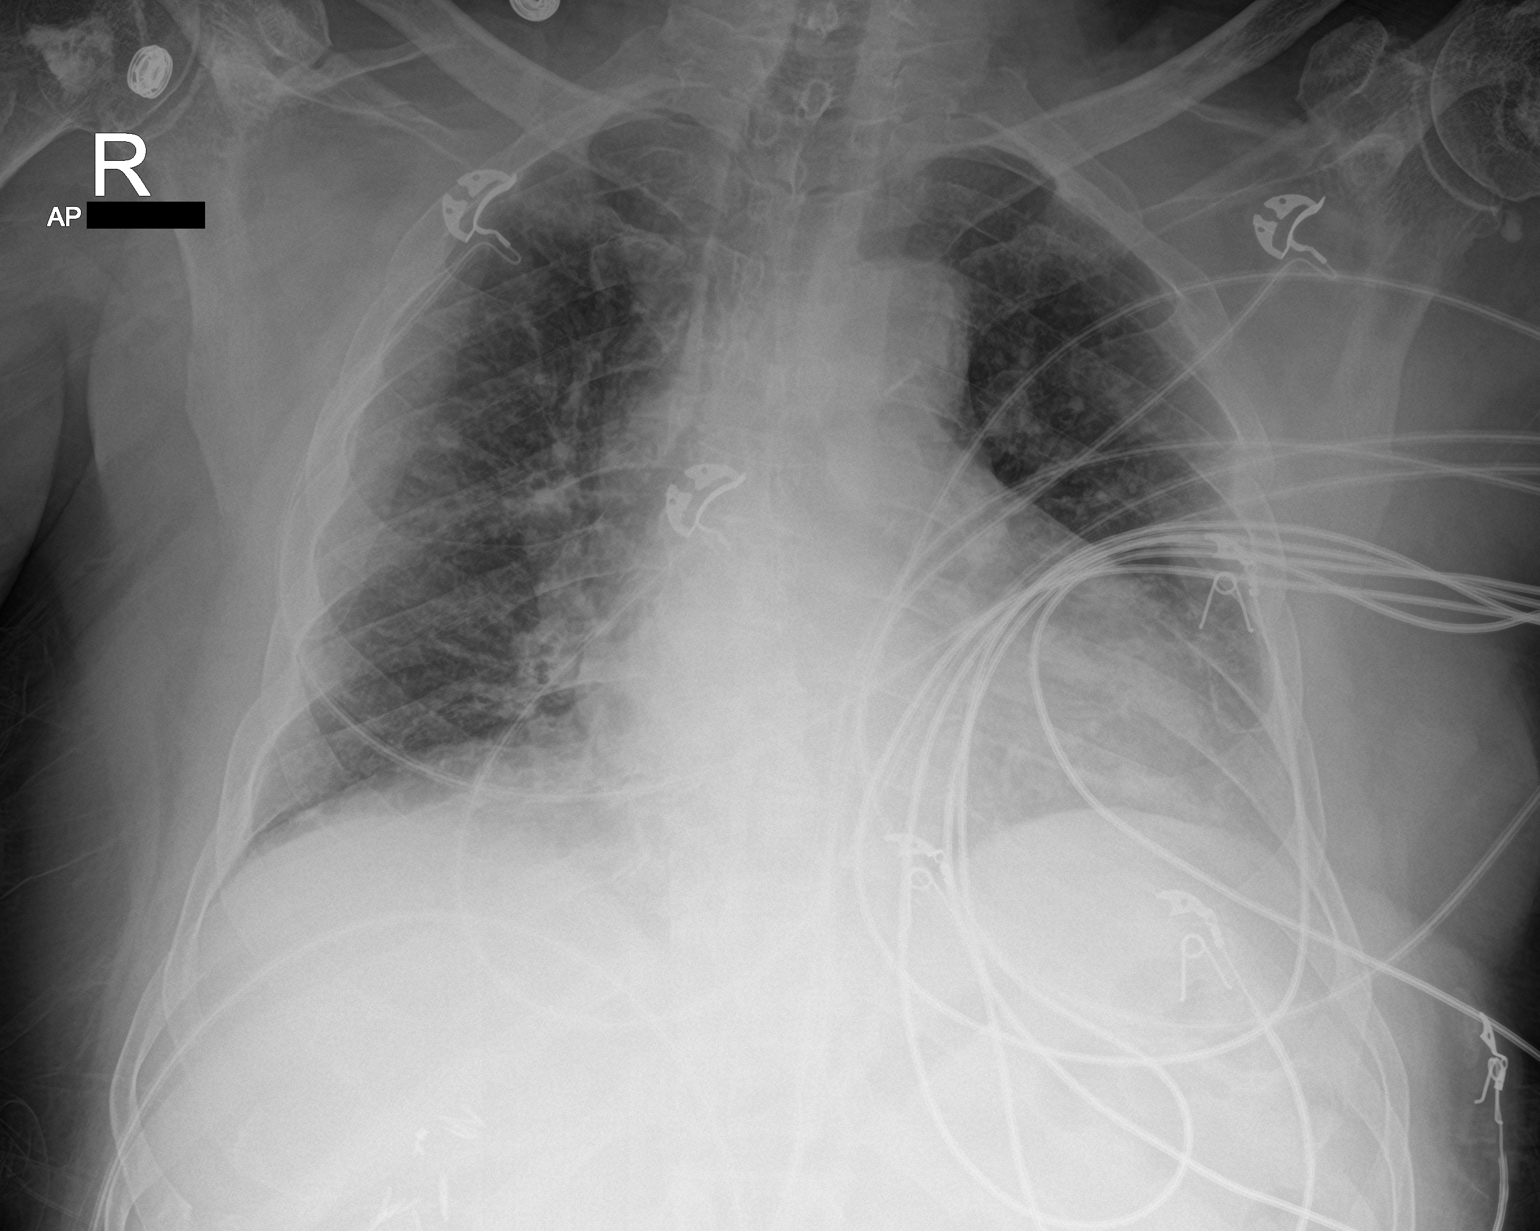

[1 of 1 positions shown; findings below may reference images not displayed]

FINDINGS: Relatively low lung volumes with resultant crowding of
bronchovascular structures, and suspicion of mild central pulmonary
vascular congestion. 7 mm nodular density projects over the mid
right lung, present since 9046 presumably benign.

Heart size upper limits normal for technique. Aortic Atherosclerosis
(ACR3K-170.0).

No effusion.  No pneumothorax.

Visualized bones unremarkable. Cholecystectomy clips. Stable
sclerosis in the right humeral head.
IMPRESSION: Low volumes, possible mild pulmonary vascular congestion

## 2022-11-13 DIAGNOSIS — M170 Bilateral primary osteoarthritis of knee: Secondary | ICD-10-CM | POA: Diagnosis not present

## 2022-11-13 DIAGNOSIS — E118 Type 2 diabetes mellitus with unspecified complications: Secondary | ICD-10-CM | POA: Diagnosis not present

## 2022-11-13 DIAGNOSIS — Z1389 Encounter for screening for other disorder: Secondary | ICD-10-CM | POA: Diagnosis not present

## 2022-11-13 DIAGNOSIS — J449 Chronic obstructive pulmonary disease, unspecified: Secondary | ICD-10-CM | POA: Diagnosis not present

## 2022-11-13 DIAGNOSIS — Z0001 Encounter for general adult medical examination with abnormal findings: Secondary | ICD-10-CM | POA: Diagnosis not present

## 2022-11-13 IMAGING — RF DG HIP (WITH PELVIS) OPERATIVE*L*
1 series · 2 of 2 positions shown · non-contrast
Comparison: 05/24/2021

CLINICAL DATA: Internal fixation of left femur fracture.

EXAM:
OPERATIVE LEFT HIP (WITH PELVIS IF PERFORMED) 2 VIEWS
TECHNIQUE: Fluoroscopic spot image(s) were submitted for interpretation
post-operatively.

[Series 1: unknown protocol · 0.20mm/px · 2 of 2 slices shown]
[im 1/2]
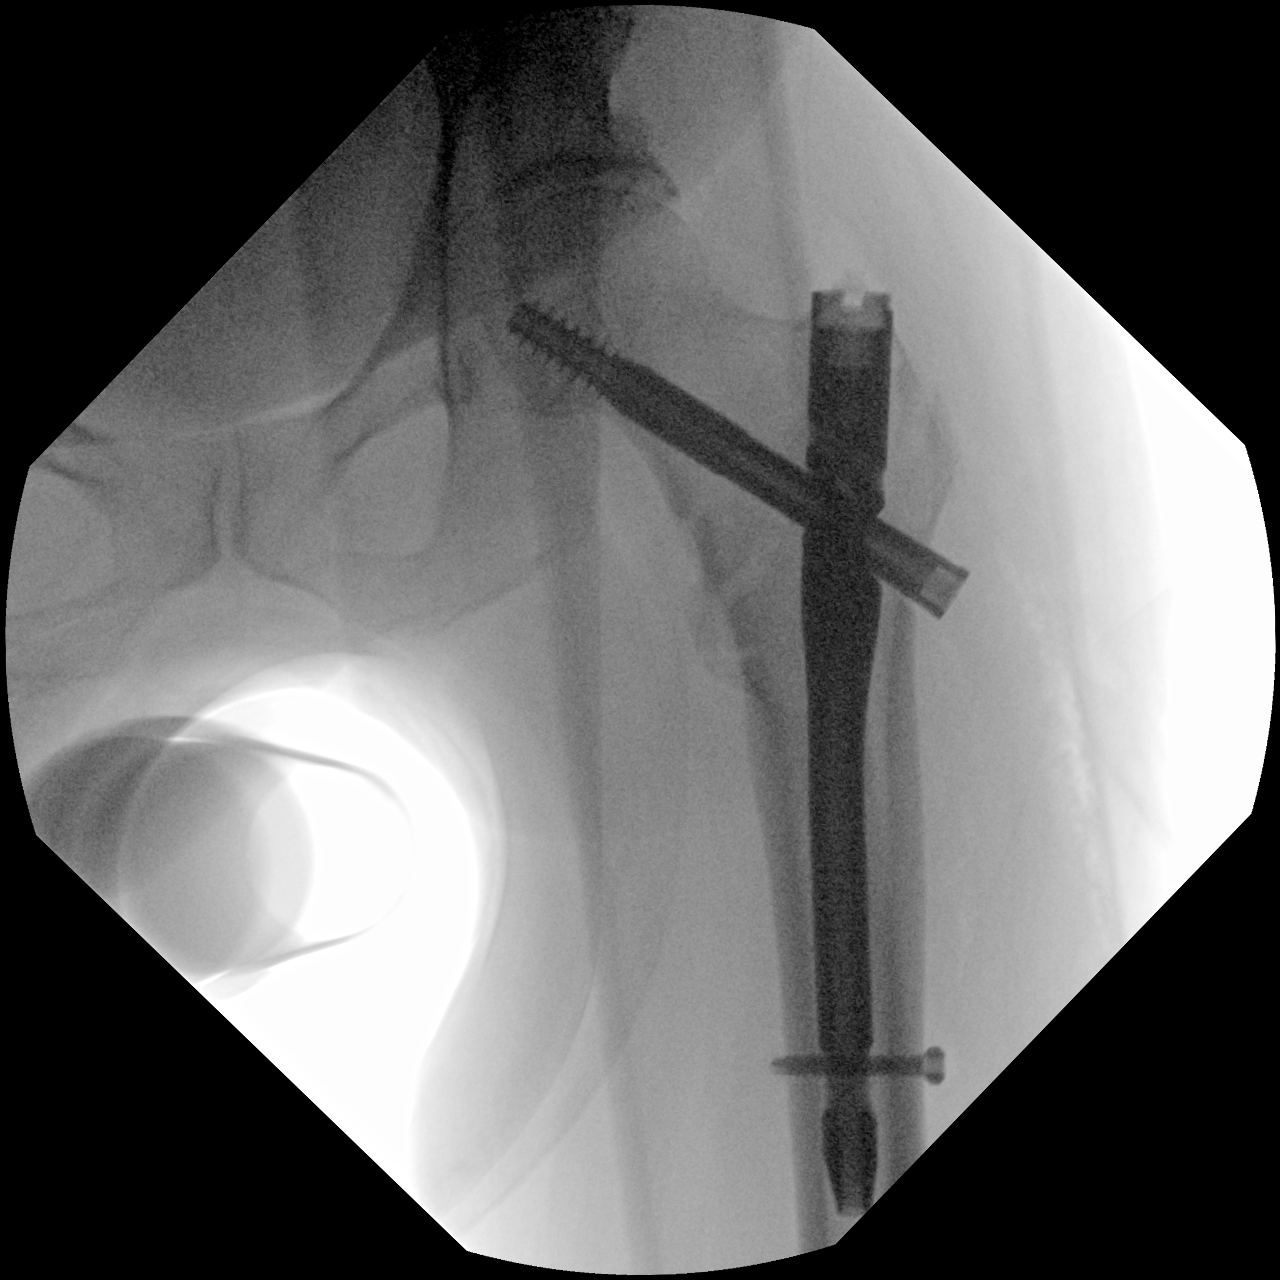
[im 2/2]
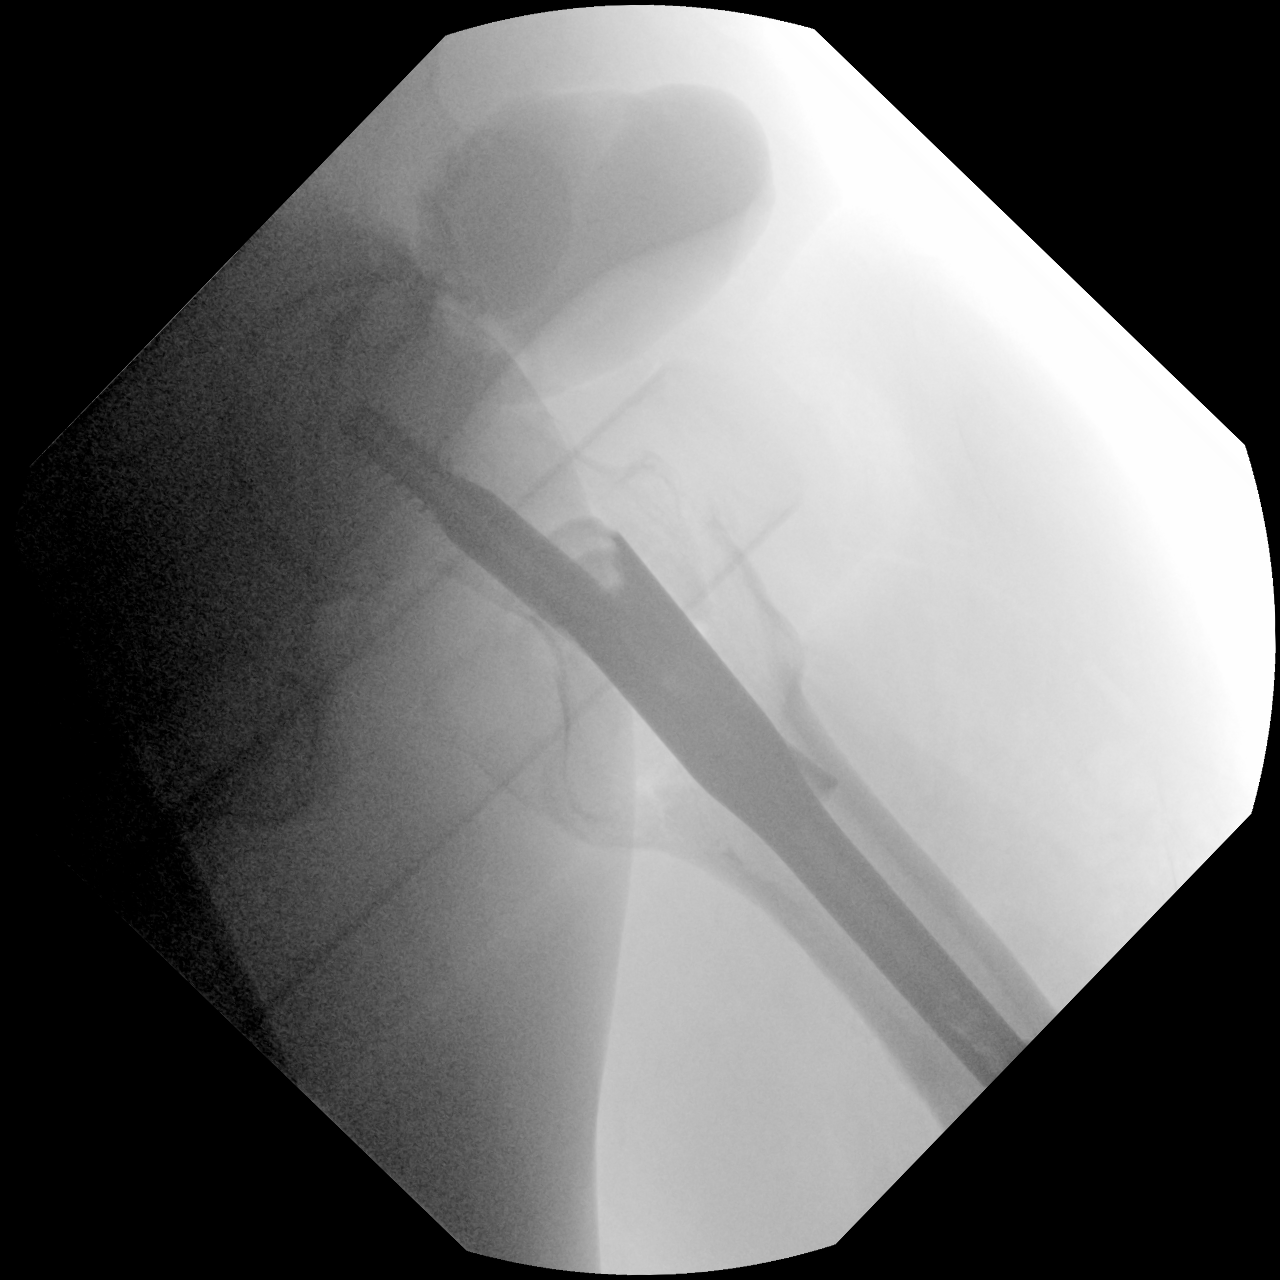

[2 of 2 positions shown; findings below may reference images not displayed]

FINDINGS: Internal fixation of left intertrochanteric hip fracture with a
dynamic hip screw. Short intramedullary nail in the proximal left
femur with a distal interlocking screw. Left hip is located on these
images.
IMPRESSION: Internal fixation of left intertrochanteric femur fracture.

## 2022-11-13 IMAGING — DX DG PORTABLE PELVIS
1 series · 1 of 1 positions shown · non-contrast
Comparison: Preoperative radiograph yesterday

CLINICAL DATA: Status post left hip IM nail.

EXAM:
PORTABLE PELVIS 1-2 VIEWS

[pelvis ap]
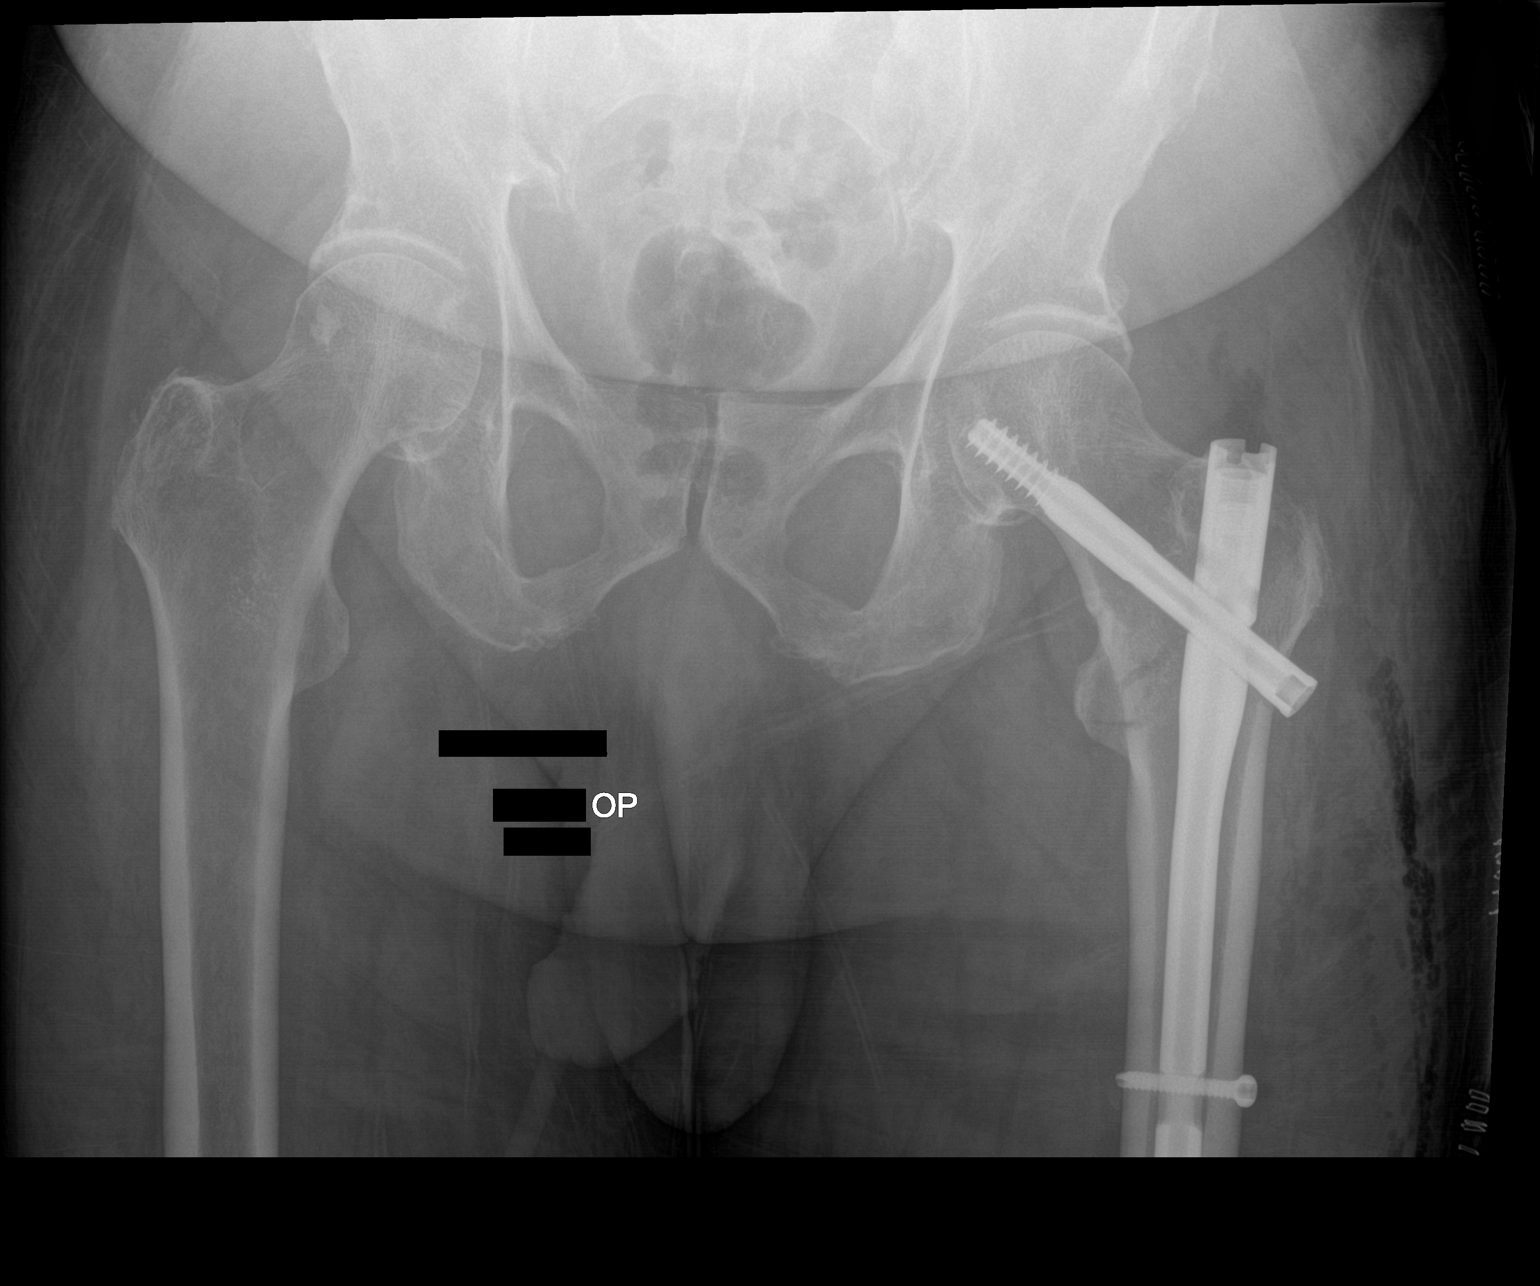

[1 of 1 positions shown; findings below may reference images not displayed]

FINDINGS: Intramedullary nail with trans trochanteric and distal screw
fixation of intertrochanteric femur fracture. No periprosthetic
lucency. Recent postsurgical change includes air and edema in the
soft tissues as well as skin staples laterally.
IMPRESSION: ORIF of intertrochanteric femur fracture without immediate
postoperative complication.

## 2022-12-13 DIAGNOSIS — J441 Chronic obstructive pulmonary disease with (acute) exacerbation: Secondary | ICD-10-CM | POA: Diagnosis not present

## 2022-12-13 DIAGNOSIS — I10 Essential (primary) hypertension: Secondary | ICD-10-CM | POA: Diagnosis not present

## 2023-01-07 DIAGNOSIS — M79672 Pain in left foot: Secondary | ICD-10-CM | POA: Diagnosis not present

## 2023-01-07 DIAGNOSIS — I739 Peripheral vascular disease, unspecified: Secondary | ICD-10-CM | POA: Diagnosis not present

## 2023-01-07 DIAGNOSIS — M79671 Pain in right foot: Secondary | ICD-10-CM | POA: Diagnosis not present

## 2023-01-07 DIAGNOSIS — E1140 Type 2 diabetes mellitus with diabetic neuropathy, unspecified: Secondary | ICD-10-CM | POA: Diagnosis not present

## 2023-01-07 DIAGNOSIS — L110 Acquired keratosis follicularis: Secondary | ICD-10-CM | POA: Diagnosis not present

## 2023-01-11 DIAGNOSIS — J449 Chronic obstructive pulmonary disease, unspecified: Secondary | ICD-10-CM | POA: Diagnosis not present

## 2023-01-11 DIAGNOSIS — I10 Essential (primary) hypertension: Secondary | ICD-10-CM | POA: Diagnosis not present

## 2023-01-16 ENCOUNTER — Ambulatory Visit: Payer: Medicare Other | Admitting: Urology

## 2023-01-16 VITALS — BP 100/59 | HR 76

## 2023-01-16 DIAGNOSIS — R310 Gross hematuria: Secondary | ICD-10-CM | POA: Diagnosis not present

## 2023-01-16 DIAGNOSIS — R351 Nocturia: Secondary | ICD-10-CM

## 2023-01-16 LAB — URINALYSIS, ROUTINE W REFLEX MICROSCOPIC
Bilirubin, UA: NEGATIVE
Glucose, UA: NEGATIVE
Ketones, UA: NEGATIVE
Leukocytes,UA: NEGATIVE
Nitrite, UA: NEGATIVE
Protein,UA: NEGATIVE
RBC, UA: NEGATIVE
Specific Gravity, UA: 1.02 (ref 1.005–1.030)
Urobilinogen, Ur: 0.2 mg/dL (ref 0.2–1.0)
pH, UA: 5.5 (ref 5.0–7.5)

## 2023-01-16 MED ORDER — FINASTERIDE 5 MG PO TABS: 5.0000 mg | ORAL_TABLET | Freq: Every day | ORAL | 3 refills | Status: DC

## 2023-01-16 NOTE — Progress Notes (Signed)
01/16/2023 10:33 AM   Hayden Green 1928/09/27 [id number]  Referring provider: Carrolyn Meiers, MD [place],  [place] [address]  Followup gross hematuria and Nocturia   HPI: Hayden Green is a 89yo here for followup for gross hematuria and nocturia. He stopped finasteride and uroxatral due to feet swelling. He denies nay gross hematuria. He has nocturia 3-4x. Urine stream strong. He is not bothered by the nocturia. IPSS 15 QOL 2. No straining to urinate. No recent UTI   PMH: Past Medical History:  Diagnosis Date   Anemia, unspecified 05/24/2021   Arthritis    COPD (chronic obstructive pulmonary disease) (HCC)    Diabetes mellitus without complication (HCC)    GERD (gastroesophageal reflux disease)    Heartburn    History of renal cell carcinoma 05/24/2021   Hypertension    Pneumonia    Renal cell carcinoma (HCC)     Surgical History: Past Surgical History:  Procedure Laterality Date   CHOLECYSTECTOMY     ESOPHAGOGASTRODUODENOSCOPY N/A 08/07/2017   Dr. Gala Romney: web in proximal esophagus, moderate acquired Schatzki's ring s/p dilation with 78 and 1 F, three-quarter bites with biopsy forceps to facilitate disruption. small hiatal hernia, duodenum normal   INTRAMEDULLARY (IM) NAIL INTERTROCHANTERIC Left 05/25/2021   Procedure: INTRAMEDULLARY (IM) NAIL INTERTROCHANTRIC;  Surgeon: Rod Can, MD;  Location: [facility];  Service: Orthopedics;  Laterality: Left;   MALONEY DILATION N/A 08/07/2017   Procedure: Venia Minks DILATION;  Surgeon: Daneil Dolin, MD;  [facility];  Service: Endoscopy;  Laterality: N/A;   right nephrectomy      Home Medications:  Allergies as of 01/16/2023       Reactions   Reglan [metoclopramide] Swelling, Other (See Comments)   Caused oral swelling-states that this medication was taken once or twice in the past and the reaction was the same each time        Medication List        Accurate as of January 16, 2023 10:33  AM. If you have any questions, ask your nurse or doctor.          Accu-Chek FastClix Lancets Misc   Accu-Chek Guide test strip Generic drug: glucose blood   albuterol 108 (90 Base) MCG/ACT inhaler Commonly known as: VENTOLIN HFA Inhale 2 puffs into the lungs every 6 (six) hours as needed for wheezing or shortness of breath.   alfuzosin 10 MG 24 hr tablet Commonly known as: UROXATRAL Take 1 tablet (10 mg total) by mouth at bedtime.   finasteride 5 MG tablet Commonly known as: PROSCAR TAKE ONE TABLET BY MOUTH ONCE DAILY   HYDROcodone-acetaminophen 5-325 MG tablet Commonly known as: NORCO/VICODIN Take 1-2 tablets by mouth every 4 (four) hours as needed for moderate pain (pain score 4-6).   ketorolac 0.5 % ophthalmic solution Commonly known as: ACULAR Place 1 drop into the right eye 4 (four) times daily.   lisinopril-hydrochlorothiazide 20-12.5 MG tablet Commonly known as: ZESTORETIC Take 1 tablet by mouth daily.   Lumigan 0.01 % Soln Generic drug: bimatoprost Place 1 drop into both eyes at bedtime.   meclizine 25 MG tablet Commonly known as: ANTIVERT Take 25 mg by mouth 4 (four) times daily as needed for dizziness.   metFORMIN 500 MG tablet Commonly known as: Glucophage Take 1 tablet (500 mg total) by mouth 2 (two) times daily with a meal.   methocarbamol 500 MG tablet Commonly known as: ROBAXIN Take 1 tablet (500 mg total) by mouth every  8 (eight) hours as needed for muscle spasms.   multivitamin with minerals Tabs tablet Take 1 tablet by mouth daily.   naproxen sodium 220 MG tablet Commonly known as: ALEVE Take 220 mg by mouth daily as needed (pain).   omeprazole 20 MG capsule Commonly known as: PRILOSEC Take 20 mg by mouth daily as needed (for acid reflux).   polyethylene glycol 17 g packet Commonly known as: MIRALAX / GLYCOLAX Take 17 g by mouth 2 (two) times daily.   senna-docusate 8.6-50 MG tablet Commonly known as: Senokot-S Take 2 tablets by  mouth 2 (two) times daily.   Simbrinza 1-0.2 % Susp Generic drug: Brinzolamide-Brimonidine Place 1 drop into both eyes 2 (two) times daily.   Vyzulta 0.024 % Soln Generic drug: Latanoprostene Bunod Place 1 drop into both eyes at bedtime.        Allergies:  Allergies  Allergen Reactions   Reglan [Metoclopramide] Swelling and Other (See Comments)    Caused oral swelling-states that this medication was taken once or twice in the past and the reaction was the same each time    Family History: Family History  Problem Relation Age of Onset   Colon cancer Neg Hx    Colon polyps Neg Hx     Social History:  reports that he quit smoking about 17 years ago. He has quit using smokeless tobacco. He reports that he does not drink alcohol and does not use drugs.  ROS: All other review of systems were reviewed and are negative except what is noted above in HPI  Physical Exam: BP (!) 100/59   Pulse 76   Constitutional:  Alert and oriented, No acute distress. HEENT: [place] AT, moist mucus membranes.  Trachea midline, no masses. Cardiovascular: No clubbing, cyanosis, or edema. Respiratory: Normal respiratory effort, no increased work of breathing. GI: Abdomen is soft, nontender, nondistended, no abdominal masses GU: No CVA tenderness.  Lymph: No cervical or inguinal lymphadenopathy. Skin: No rashes, bruises or suspicious lesions. Neurologic: Grossly intact, no focal deficits, moving all 4 extremities. Psychiatric: Normal mood and affect.  Laboratory Data: Lab Results  Component Value Date   WBC 6.0 06/28/2021   HGB 10.2 (L) 06/28/2021   HCT 33.3 (L) 06/28/2021   MCV 92.2 06/28/2021   PLT 186 06/28/2021    Lab Results  Component Value Date   CREATININE 1.21 06/28/2021    No results found for: "PSA"  No results found for: "TESTOSTERONE"  Lab Results  Component Value Date   HGBA1C 5.3 06/28/2021    Urinalysis    Component Value Date/Time   COLORURINE RED (A) 07/02/2020  0957   APPEARANCEUR Clear 01/15/2022 0958   LABSPEC 1.015 07/02/2020 0957   PHURINE 7.0 07/02/2020 0957   GLUCOSEU Negative 01/15/2022 0958   HGBUR (A) 07/02/2020 0957    TEST NOT REPORTED DUE TO COLOR INTERFERENCE OF URINE PIGMENT   BILIRUBINUR Negative 01/15/2022 0958   KETONESUR (A) 07/02/2020 0957    TEST NOT REPORTED DUE TO COLOR INTERFERENCE OF URINE PIGMENT   PROTEINUR Negative 01/15/2022 0958   PROTEINUR (A) 07/02/2020 0957    TEST NOT REPORTED DUE TO COLOR INTERFERENCE OF URINE PIGMENT   UROBILINOGEN 0.2 09/08/2011 0730   NITRITE Negative 01/15/2022 0958   NITRITE (A) 07/02/2020 0957    TEST NOT REPORTED DUE TO COLOR INTERFERENCE OF URINE PIGMENT   LEUKOCYTESUR Negative 01/15/2022 0958   LEUKOCYTESUR (A) 07/02/2020 0957    TEST NOT REPORTED DUE TO COLOR INTERFERENCE OF URINE PIGMENT  Lab Results  Component Value Date   LABMICR Comment 01/15/2022   WBCUA None seen 07/06/2020   LABEPIT None seen 07/06/2020   BACTERIA None seen 07/06/2020    Pertinent Imaging:  Results for orders placed in visit on 06/17/02  DG Abd 1 View  Narrative FINDINGS CLINICAL DATA:  ABDOMINAL PAIN. ABDOMEN 1 VIEW NO URINARY TRACT CALCIFICATION.  NORMAL GAS PATTERN WITHOUT SIGNS OF BOWEL DILATATION OR OBSTRUCTION.  SKELETAL STRUCTUES UNREMARKABLE.  SI JOINTS PRESERVED. IMPRESSION NO ACUTE ABNORMALITIES.  No results found for this or any previous visit.  No results found for this or any previous visit.  No results found for this or any previous visit.  No results found for this or any previous visit.  No valid procedures specified. No results found for this or any previous visit.  Results for orders placed during the hospital encounter of 07/02/20  CT Renal Stone Study  Narrative CLINICAL DATA:  Hematuria noticed this morning, unknown cause, denies pain  EXAM: CT ABDOMEN AND PELVIS WITHOUT CONTRAST  TECHNIQUE: Multidetector CT imaging of the abdomen and pelvis was  performed following the standard protocol without IV contrast. Oral contrast not administered for this indication. Sagittal and coronal MPR images reconstructed from axial data set.  COMPARISON:  11/05/2007 CT abdomen, CT abdomen and pelvis 04/14/2004 intermediate to high attenuation mass at posteroinferior bladder  FINDINGS: Lower chest: Peribronchial thickening and volume loss in the lower lobes. Calcified granuloma RIGHT lower lobe.  Hepatobiliary: Small cyst lateral segment LEFT lobe liver unchanged. Calcified granuloma anterior liver. Gallbladder surgically absent. No additional hepatic abnormalities.  Pancreas: Normal appearance  Spleen: Normal appearance  Adrenals/Urinary Tract: Indeterminate LEFT adrenal nodule 2.5 x 2.2 cm image 17, unchanged. Probable small cyst LEFT kidney 10 mm diameter slightly increased. Questionable small cyst 12 mm mid LEFT kidney image 24. Kidneys and ureters otherwise unremarkable. Intermediate attenuation "mass" at inferior bladder posteriorly, 4.8 x 3.5 x 2.0 cm, question clot versus tumor, not felt to be related to prostate gland. No urinary tract calcification or dilatation seen.  Stomach/Bowel: Normal appendix. Mild sigmoid diverticulosis without evidence of diverticulitis. Gastric wall appears thickened though this may be an artifact related to collapsed state. Remaining bowel loops unremarkable.  Vascular/Lymphatic: Minimal atherosclerotic calcification aorta and iliac arteries. Aorta normal caliber. No adenopathy.  Reproductive: Minimal prostatic enlargement.  Other: No free air or free fluid.  No acute inflammatory process.  Musculoskeletal: Unremarkable  IMPRESSION: Intermediate attenuation "mass" at inferior bladder posteriorly 4.8 x 3.5 x 2.0 cm, question clot versus tumor, not felt to be related to prostate gland; recommend correlation with cystoscopy.  Stable LEFT adrenal nodule 2.5 x 2.2 cm.  Probable small renal  cysts.  Mild sigmoid diverticulosis without evidence of diverticulitis.  Bronchitic changes and volume loss in the lower lobes.  Aortic Atherosclerosis (ICD10-I70.0).   Electronically Signed By: Lavonia Dana M.D. On: 07/02/2020 12:24   Assessment & Plan:    1. Nocturia -restart finasteride. -decrease fluid intake within 2 hour sof going to bed  2. Gross hematuria -restart finasteride - Urinalysis, Routine w reflex microscopic   No follow-ups on file.  Nicolette Bang, MD  [facility] [place]  \

## 2023-01-18 ENCOUNTER — Encounter: Payer: Self-pay | Admitting: Urology

## 2023-01-18 NOTE — Patient Instructions (Signed)

## 2023-02-11 DIAGNOSIS — I10 Essential (primary) hypertension: Secondary | ICD-10-CM | POA: Diagnosis not present

## 2023-02-11 DIAGNOSIS — J441 Chronic obstructive pulmonary disease with (acute) exacerbation: Secondary | ICD-10-CM | POA: Diagnosis not present

## 2023-02-12 DIAGNOSIS — H47233 Glaucomatous optic atrophy, bilateral: Secondary | ICD-10-CM | POA: Diagnosis not present

## 2023-02-12 DIAGNOSIS — H401123 Primary open-angle glaucoma, left eye, severe stage: Secondary | ICD-10-CM | POA: Diagnosis not present

## 2023-02-12 DIAGNOSIS — H401112 Primary open-angle glaucoma, right eye, moderate stage: Secondary | ICD-10-CM | POA: Diagnosis not present

## 2023-03-25 DIAGNOSIS — L110 Acquired keratosis follicularis: Secondary | ICD-10-CM | POA: Diagnosis not present

## 2023-03-25 DIAGNOSIS — I739 Peripheral vascular disease, unspecified: Secondary | ICD-10-CM | POA: Diagnosis not present

## 2023-03-25 DIAGNOSIS — M79672 Pain in left foot: Secondary | ICD-10-CM | POA: Diagnosis not present

## 2023-03-25 DIAGNOSIS — M79671 Pain in right foot: Secondary | ICD-10-CM | POA: Diagnosis not present

## 2023-03-25 DIAGNOSIS — E1140 Type 2 diabetes mellitus with diabetic neuropathy, unspecified: Secondary | ICD-10-CM | POA: Diagnosis not present

## 2023-03-27 DIAGNOSIS — J441 Chronic obstructive pulmonary disease with (acute) exacerbation: Secondary | ICD-10-CM | POA: Diagnosis not present

## 2023-03-27 DIAGNOSIS — I10 Essential (primary) hypertension: Secondary | ICD-10-CM | POA: Diagnosis not present

## 2023-04-08 DIAGNOSIS — R519 Headache, unspecified: Secondary | ICD-10-CM | POA: Diagnosis not present

## 2023-04-08 DIAGNOSIS — E118 Type 2 diabetes mellitus with unspecified complications: Secondary | ICD-10-CM | POA: Diagnosis not present

## 2023-04-19 ENCOUNTER — Other Ambulatory Visit: Payer: Self-pay

## 2023-04-19 MED ORDER — FINASTERIDE 5 MG PO TABS: 5.0000 mg | ORAL_TABLET | Freq: Every day | ORAL | 3 refills | Status: AC

## 2023-05-08 DIAGNOSIS — J449 Chronic obstructive pulmonary disease, unspecified: Secondary | ICD-10-CM | POA: Diagnosis not present

## 2023-05-08 DIAGNOSIS — I10 Essential (primary) hypertension: Secondary | ICD-10-CM | POA: Diagnosis not present

## 2023-05-08 DIAGNOSIS — E118 Type 2 diabetes mellitus with unspecified complications: Secondary | ICD-10-CM | POA: Diagnosis not present

## 2023-06-03 DIAGNOSIS — M79672 Pain in left foot: Secondary | ICD-10-CM | POA: Diagnosis not present

## 2023-06-03 DIAGNOSIS — L110 Acquired keratosis follicularis: Secondary | ICD-10-CM | POA: Diagnosis not present

## 2023-06-03 DIAGNOSIS — E1140 Type 2 diabetes mellitus with diabetic neuropathy, unspecified: Secondary | ICD-10-CM | POA: Diagnosis not present

## 2023-06-03 DIAGNOSIS — I739 Peripheral vascular disease, unspecified: Secondary | ICD-10-CM | POA: Diagnosis not present

## 2023-06-03 DIAGNOSIS — M79671 Pain in right foot: Secondary | ICD-10-CM | POA: Diagnosis not present

## 2023-06-08 DIAGNOSIS — I10 Essential (primary) hypertension: Secondary | ICD-10-CM | POA: Diagnosis not present

## 2023-06-08 DIAGNOSIS — J441 Chronic obstructive pulmonary disease with (acute) exacerbation: Secondary | ICD-10-CM | POA: Diagnosis not present

## 2023-07-09 DIAGNOSIS — J441 Chronic obstructive pulmonary disease with (acute) exacerbation: Secondary | ICD-10-CM | POA: Diagnosis not present

## 2023-07-09 DIAGNOSIS — I10 Essential (primary) hypertension: Secondary | ICD-10-CM | POA: Diagnosis not present

## 2023-07-10 DIAGNOSIS — Z23 Encounter for immunization: Secondary | ICD-10-CM | POA: Diagnosis not present

## 2023-08-08 DIAGNOSIS — I10 Essential (primary) hypertension: Secondary | ICD-10-CM | POA: Diagnosis not present

## 2023-08-08 DIAGNOSIS — U071 COVID-19: Secondary | ICD-10-CM | POA: Diagnosis not present

## 2023-08-08 DIAGNOSIS — J441 Chronic obstructive pulmonary disease with (acute) exacerbation: Secondary | ICD-10-CM | POA: Diagnosis not present

## 2023-08-12 DIAGNOSIS — I739 Peripheral vascular disease, unspecified: Secondary | ICD-10-CM | POA: Diagnosis not present

## 2023-08-12 DIAGNOSIS — L110 Acquired keratosis follicularis: Secondary | ICD-10-CM | POA: Diagnosis not present

## 2023-08-12 DIAGNOSIS — M79672 Pain in left foot: Secondary | ICD-10-CM | POA: Diagnosis not present

## 2023-08-12 DIAGNOSIS — M79671 Pain in right foot: Secondary | ICD-10-CM | POA: Diagnosis not present

## 2023-08-12 DIAGNOSIS — E1140 Type 2 diabetes mellitus with diabetic neuropathy, unspecified: Secondary | ICD-10-CM | POA: Diagnosis not present

## 2023-09-08 DIAGNOSIS — I10 Essential (primary) hypertension: Secondary | ICD-10-CM | POA: Diagnosis not present

## 2023-09-08 DIAGNOSIS — J441 Chronic obstructive pulmonary disease with (acute) exacerbation: Secondary | ICD-10-CM | POA: Diagnosis not present

## 2023-10-08 DIAGNOSIS — I10 Essential (primary) hypertension: Secondary | ICD-10-CM | POA: Diagnosis not present

## 2023-10-08 DIAGNOSIS — J441 Chronic obstructive pulmonary disease with (acute) exacerbation: Secondary | ICD-10-CM | POA: Diagnosis not present

## 2023-10-21 DIAGNOSIS — M79671 Pain in right foot: Secondary | ICD-10-CM | POA: Diagnosis not present

## 2023-10-21 DIAGNOSIS — M79672 Pain in left foot: Secondary | ICD-10-CM | POA: Diagnosis not present

## 2023-10-21 DIAGNOSIS — E1140 Type 2 diabetes mellitus with diabetic neuropathy, unspecified: Secondary | ICD-10-CM | POA: Diagnosis not present

## 2023-10-21 DIAGNOSIS — L110 Acquired keratosis follicularis: Secondary | ICD-10-CM | POA: Diagnosis not present

## 2023-10-21 DIAGNOSIS — I739 Peripheral vascular disease, unspecified: Secondary | ICD-10-CM | POA: Diagnosis not present

## 2023-11-07 DIAGNOSIS — E119 Type 2 diabetes mellitus without complications: Secondary | ICD-10-CM | POA: Diagnosis not present

## 2023-11-07 DIAGNOSIS — I10 Essential (primary) hypertension: Secondary | ICD-10-CM | POA: Diagnosis not present

## 2023-11-07 DIAGNOSIS — J449 Chronic obstructive pulmonary disease, unspecified: Secondary | ICD-10-CM | POA: Diagnosis not present

## 2023-11-07 DIAGNOSIS — Z1389 Encounter for screening for other disorder: Secondary | ICD-10-CM | POA: Diagnosis not present

## 2023-11-07 DIAGNOSIS — H4010X0 Unspecified open-angle glaucoma, stage unspecified: Secondary | ICD-10-CM | POA: Diagnosis not present

## 2023-11-07 DIAGNOSIS — K219 Gastro-esophageal reflux disease without esophagitis: Secondary | ICD-10-CM | POA: Diagnosis not present

## 2023-11-07 DIAGNOSIS — Z0001 Encounter for general adult medical examination with abnormal findings: Secondary | ICD-10-CM | POA: Diagnosis not present

## 2023-11-19 DIAGNOSIS — R519 Headache, unspecified: Secondary | ICD-10-CM | POA: Diagnosis not present

## 2023-11-19 DIAGNOSIS — N183 Chronic kidney disease, stage 3 unspecified: Secondary | ICD-10-CM | POA: Diagnosis not present

## 2023-11-19 DIAGNOSIS — E119 Type 2 diabetes mellitus without complications: Secondary | ICD-10-CM | POA: Diagnosis not present

## 2023-11-19 DIAGNOSIS — I10 Essential (primary) hypertension: Secondary | ICD-10-CM | POA: Diagnosis not present

## 2023-11-19 DIAGNOSIS — M170 Bilateral primary osteoarthritis of knee: Secondary | ICD-10-CM | POA: Diagnosis not present

## 2023-12-08 DIAGNOSIS — J441 Chronic obstructive pulmonary disease with (acute) exacerbation: Secondary | ICD-10-CM | POA: Diagnosis not present

## 2023-12-08 DIAGNOSIS — I10 Essential (primary) hypertension: Secondary | ICD-10-CM | POA: Diagnosis not present

## 2023-12-30 DIAGNOSIS — E1140 Type 2 diabetes mellitus with diabetic neuropathy, unspecified: Secondary | ICD-10-CM | POA: Diagnosis not present

## 2023-12-30 DIAGNOSIS — M79672 Pain in left foot: Secondary | ICD-10-CM | POA: Diagnosis not present

## 2023-12-30 DIAGNOSIS — M79671 Pain in right foot: Secondary | ICD-10-CM | POA: Diagnosis not present

## 2023-12-30 DIAGNOSIS — I739 Peripheral vascular disease, unspecified: Secondary | ICD-10-CM | POA: Diagnosis not present

## 2023-12-30 DIAGNOSIS — L110 Acquired keratosis follicularis: Secondary | ICD-10-CM | POA: Diagnosis not present

## 2023-12-31 DIAGNOSIS — H47233 Glaucomatous optic atrophy, bilateral: Secondary | ICD-10-CM | POA: Diagnosis not present

## 2023-12-31 DIAGNOSIS — H401112 Primary open-angle glaucoma, right eye, moderate stage: Secondary | ICD-10-CM | POA: Diagnosis not present

## 2023-12-31 DIAGNOSIS — H401123 Primary open-angle glaucoma, left eye, severe stage: Secondary | ICD-10-CM | POA: Diagnosis not present

## 2023-12-31 DIAGNOSIS — E119 Type 2 diabetes mellitus without complications: Secondary | ICD-10-CM | POA: Diagnosis not present

## 2024-01-05 DIAGNOSIS — I10 Essential (primary) hypertension: Secondary | ICD-10-CM | POA: Diagnosis not present

## 2024-01-05 DIAGNOSIS — J441 Chronic obstructive pulmonary disease with (acute) exacerbation: Secondary | ICD-10-CM | POA: Diagnosis not present

## 2024-01-13 ENCOUNTER — Ambulatory Visit: Payer: Medicare Other | Admitting: Urology

## 2024-01-13 ENCOUNTER — Encounter: Payer: Self-pay | Admitting: Urology

## 2024-01-13 VITALS — BP 97/58 | HR 71

## 2024-01-13 DIAGNOSIS — Z87898 Personal history of other specified conditions: Secondary | ICD-10-CM

## 2024-01-13 DIAGNOSIS — R310 Gross hematuria: Secondary | ICD-10-CM

## 2024-01-13 DIAGNOSIS — R351 Nocturia: Secondary | ICD-10-CM

## 2024-01-13 LAB — URINALYSIS, ROUTINE W REFLEX MICROSCOPIC
Bilirubin, UA: NEGATIVE
Glucose, UA: NEGATIVE
Ketones, UA: NEGATIVE
Leukocytes,UA: NEGATIVE
Nitrite, UA: NEGATIVE
Protein,UA: NEGATIVE
RBC, UA: NEGATIVE
Specific Gravity, UA: 1.02 (ref 1.005–1.030)
Urobilinogen, Ur: 1 mg/dL (ref 0.2–1.0)
pH, UA: 6 (ref 5.0–7.5)

## 2024-01-13 NOTE — Patient Instructions (Signed)
 Blood in the Pee (Hematuria) in Adults: What to Know  Hematuria is blood in the pee. You may be able to see blood in the pee. In some cases, a health care provider may find blood with a test.  Blood in the pee can be caused by infections of the kidney, bladder, or the urethra. The urethra is the tube that drains pee from the bladder.  Other causes may include: Kidney stones. Infection of the prostate. Cancer. Too much calcium in the pee. Conditions that are passed from parent to child. Too much exercise. Infections can be treated with medicine. A kidney stone will usually leave your body when you pee. If infections or kidney stones didn't cause the blood in the urine, then more tests may be needed. It is very important to tell your provider about any blood in your pee, even if you have no pain or the blood stops with no treatment. Blood in the pee can be a sign of a very serious problem, such as cancer. Follow these instructions at home: Medicines Take your medicines only as told. If you were given antibiotics, take them as told. Do not stop taking them even if you start to feel better. Eating and drinking Drink more fluids as told. Aim to drink 3-4 quarts (2.8-3.8 L) a day. Avoid caffeine, tea, and carbonated drinks. These can bother the bladder. Avoid alcohol if a male because it may irritate the prostate. General instructions If you have been diagnosed with a kidney stone, strain your pee to catch the stone if told by your provider. Empty your bladder often. Avoid holding pee for a long time. If you're male, make sure that: You wipe from front to back after using the bathroom. You use each piece of toilet paper only once. You pee before and after sex. It's up to you to get the results of any tests. Ask when your results will be ready and how to get them. You may need to call or meet with your provider to get your results. Keep all follow-up visits. Your provider will need to know  about any changes or any new symptoms. Contact a health care provider if: Your symptoms don't get better after 3 days. Your symptoms get worse. You have back pain or belly pain. You have a fever or chills. You throw up or feel like you may throw up. You throw up every time you take medicine. Get help right away if: You pass blood clots in your pee. You pass out. These symptoms may be an emergency. Call 911 right away. Do not wait to see if the symptoms will go away. Do not drive yourself to the hospital. This information is not intended to replace advice given to you by your health care provider. Make sure you discuss any questions you have with your health care provider. Document Revised: 08/08/2023 Document Reviewed: 07/18/2023 Elsevier Patient Education  2024 [employer].

## 2024-01-13 NOTE — Progress Notes (Unsigned)
 01/13/2024 9:48 AM   Hayden Green 1928-05-04 [id number]  Referring provider: Benetta Spar, MD [address] [place],  [place] [address]  Followup hematuria   HPI: Hayden Green is a 89yo here for followup for gross hematuria and nocturia. He stopped the finasteride 3 months ago and has not had any gross hematuria since then. IPSS 12 QOL 1 off BPH therapy. Nocturia 3-4x depending on fluid consumption. No dysuria or UTI since last visit. Uirne stream strong. He noted finasteride worsened his nocturia   PMH: Past Medical History:  Diagnosis Date   Anemia, unspecified 05/24/2021   Arthritis    COPD (chronic obstructive pulmonary disease) (HCC)    Diabetes mellitus without complication (HCC)    GERD (gastroesophageal reflux disease)    Heartburn    History of renal cell carcinoma 05/24/2021   Hypertension    Pneumonia    Renal cell carcinoma (HCC)     Surgical History: Past Surgical History:  Procedure Laterality Date   CHOLECYSTECTOMY     ESOPHAGOGASTRODUODENOSCOPY N/A 08/07/2017   Dr. Jena Gauss: web in proximal esophagus, moderate acquired Schatzki's ring s/p dilation with 52 and 54 F, three-quarter bites with biopsy forceps to facilitate disruption. small hiatal hernia, duodenum normal   INTRAMEDULLARY (IM) NAIL INTERTROCHANTERIC Left 05/25/2021   Procedure: INTRAMEDULLARY (IM) NAIL INTERTROCHANTRIC;  Surgeon: Samson Frederic, MD;  Location: [facility];  Service: Orthopedics;  Laterality: Left;   MALONEY DILATION N/A 08/07/2017   Procedure: Elease Hashimoto DILATION;  Surgeon: Corbin Ade, MD;  Location: [facility];  Service: Endoscopy;  Laterality: N/A;   right nephrectomy      Home Medications:  Allergies as of 01/13/2024       Reactions   Reglan [metoclopramide] Swelling, Other (See Comments)   Caused oral swelling-states that this medication was taken once or twice in the past and the reaction was the same each time        Medication List        Accurate as of  January 13, 2024  9:48 AM. If you have any questions, ask your nurse or doctor.          Accu-Chek FastClix Lancets Misc   Accu-Chek Guide test strip Generic drug: glucose blood   albuterol 108 (90 Base) MCG/ACT inhaler Commonly known as: VENTOLIN HFA Inhale 2 puffs into the lungs every 6 (six) hours as needed for wheezing or shortness of breath.   finasteride 5 MG tablet Commonly known as: PROSCAR Take 1 tablet (5 mg total) by mouth daily.   HYDROcodone-acetaminophen 5-325 MG tablet Commonly known as: NORCO/VICODIN Take 1-2 tablets by mouth every 4 (four) hours as needed for moderate pain (pain score 4-6).   ketorolac 0.5 % ophthalmic solution Commonly known as: ACULAR Place 1 drop into the right eye 4 (four) times daily.   lisinopril-hydrochlorothiazide 20-12.5 MG tablet Commonly known as: ZESTORETIC Take 1 tablet by mouth daily.   Lumigan 0.01 % Soln Generic drug: bimatoprost Place 1 drop into both eyes at bedtime.   meclizine 25 MG tablet Commonly known as: ANTIVERT Take 25 mg by mouth 4 (four) times daily as needed for dizziness.   metFORMIN 500 MG tablet Commonly known as: Glucophage Take 1 tablet (500 mg total) by mouth 2 (two) times daily with a meal.   methocarbamol 500 MG tablet Commonly known as: ROBAXIN Take 1 tablet (500 mg total) by mouth every 8 (eight) hours as needed for muscle spasms.   multivitamin with minerals Tabs tablet Take  1 tablet by mouth daily.   naproxen sodium 220 MG tablet Commonly known as: ALEVE Take 220 mg by mouth daily as needed (pain).   omeprazole 20 MG capsule Commonly known as: PRILOSEC Take 20 mg by mouth daily as needed (for acid reflux).   polyethylene glycol 17 g packet Commonly known as: MIRALAX / GLYCOLAX Take 17 g by mouth 2 (two) times daily.   senna-docusate 8.6-50 MG tablet Commonly known as: Senokot-S Take 2 tablets by mouth 2 (two) times daily.   Simbrinza 1-0.2 % Susp Generic drug:  Brinzolamide-Brimonidine Place 1 drop into both eyes 2 (two) times daily.   Vyzulta 0.024 % Soln Generic drug: Latanoprostene Bunod Place 1 drop into both eyes at bedtime.        Allergies:  Allergies  Allergen Reactions   Reglan [Metoclopramide] Swelling and Other (See Comments)    Caused oral swelling-states that this medication was taken once or twice in the past and the reaction was the same each time    Family History: Family History  Problem Relation Age of Onset   Colon cancer Neg Hx    Colon polyps Neg Hx     Social History:  reports that he quit smoking about 18 years ago. He started smoking about 43 years ago. He has quit using smokeless tobacco. He reports that he does not drink alcohol and does not use drugs.  ROS: All other review of systems were reviewed and are negative except what is noted above in HPI  Physical Exam: BP (!) 97/58   Pulse 71   Constitutional:  Alert and oriented, No acute distress. HEENT: [place] AT, moist mucus membranes.  Trachea midline, no masses. Cardiovascular: No clubbing, cyanosis, or edema. Respiratory: Normal respiratory effort, no increased work of breathing. GI: Abdomen is soft, nontender, nondistended, no abdominal masses GU: No CVA tenderness.  Lymph: No cervical or inguinal lymphadenopathy. Skin: No rashes, bruises or suspicious lesions. Neurologic: Grossly intact, no focal deficits, moving all 4 extremities. Psychiatric: Normal mood and affect.  Laboratory Data: Lab Results  Component Value Date   WBC 6.0 06/28/2021   HGB 10.2 (L) 06/28/2021   HCT 33.3 (L) 06/28/2021   MCV 92.2 06/28/2021   PLT 186 06/28/2021    Lab Results  Component Value Date   CREATININE 1.21 06/28/2021    No results found for: "PSA"  No results found for: "TESTOSTERONE"  Lab Results  Component Value Date   HGBA1C 5.3 06/28/2021    Urinalysis    Component Value Date/Time   COLORURINE RED (A) 07/02/2020 0957   APPEARANCEUR Clear  01/16/2023 1024   LABSPEC 1.015 07/02/2020 0957   PHURINE 7.0 07/02/2020 0957   GLUCOSEU Negative 01/16/2023 1024   HGBUR (A) 07/02/2020 0957    TEST NOT REPORTED DUE TO COLOR INTERFERENCE OF URINE PIGMENT   BILIRUBINUR Negative 01/16/2023 1024   KETONESUR (A) 07/02/2020 0957    TEST NOT REPORTED DUE TO COLOR INTERFERENCE OF URINE PIGMENT   PROTEINUR Negative 01/16/2023 1024   PROTEINUR (A) 07/02/2020 0957    TEST NOT REPORTED DUE TO COLOR INTERFERENCE OF URINE PIGMENT   UROBILINOGEN 0.2 09/08/2011 0730   NITRITE Negative 01/16/2023 1024   NITRITE (A) 07/02/2020 0957    TEST NOT REPORTED DUE TO COLOR INTERFERENCE OF URINE PIGMENT   LEUKOCYTESUR Negative 01/16/2023 1024   LEUKOCYTESUR (A) 07/02/2020 0957    TEST NOT REPORTED DUE TO COLOR INTERFERENCE OF URINE PIGMENT    Lab Results  Component Value Date  LABMICR Comment 01/16/2023   WBCUA None seen 07/06/2020   LABEPIT None seen 07/06/2020   BACTERIA None seen 07/06/2020    Pertinent Imaging: *** Results for orders placed in visit on 06/17/02  DG Abd 1 View  Narrative FINDINGS CLINICAL DATA:  ABDOMINAL PAIN. ABDOMEN 1 VIEW NO URINARY TRACT CALCIFICATION.  NORMAL GAS PATTERN WITHOUT SIGNS OF BOWEL DILATATION OR OBSTRUCTION.  SKELETAL STRUCTUES UNREMARKABLE.  SI JOINTS PRESERVED. IMPRESSION NO ACUTE ABNORMALITIES.  No results found for this or any previous visit.  No results found for this or any previous visit.  No results found for this or any previous visit.  No results found for this or any previous visit.  No results found for this or any previous visit.  No results found for this or any previous visit.  Results for orders placed during the hospital encounter of 07/02/20  CT Renal Stone Study  Narrative CLINICAL DATA:  Hematuria noticed this morning, unknown cause, denies pain  EXAM: CT ABDOMEN AND PELVIS WITHOUT CONTRAST  TECHNIQUE: Multidetector CT imaging of the abdomen and pelvis was  performed following the standard protocol without IV contrast. Oral contrast not administered for this indication. Sagittal and coronal MPR images reconstructed from axial data set.  COMPARISON:  11/05/2007 CT abdomen, CT abdomen and pelvis 04/14/2004 intermediate to high attenuation mass at posteroinferior bladder  FINDINGS: Lower chest: Peribronchial thickening and volume loss in the lower lobes. Calcified granuloma RIGHT lower lobe.  Hepatobiliary: Small cyst lateral segment LEFT lobe liver unchanged. Calcified granuloma anterior liver. Gallbladder surgically absent. No additional hepatic abnormalities.  Pancreas: Normal appearance  Spleen: Normal appearance  Adrenals/Urinary Tract: Indeterminate LEFT adrenal nodule 2.5 x 2.2 cm image 17, unchanged. Probable small cyst LEFT kidney 10 mm diameter slightly increased. Questionable small cyst 12 mm mid LEFT kidney image 24. Kidneys and ureters otherwise unremarkable. Intermediate attenuation "mass" at inferior bladder posteriorly, 4.8 x 3.5 x 2.0 cm, question clot versus tumor, not felt to be related to prostate gland. No urinary tract calcification or dilatation seen.  Stomach/Bowel: Normal appendix. Mild sigmoid diverticulosis without evidence of diverticulitis. Gastric wall appears thickened though this may be an artifact related to collapsed state. Remaining bowel loops unremarkable.  Vascular/Lymphatic: Minimal atherosclerotic calcification aorta and iliac arteries. Aorta normal caliber. No adenopathy.  Reproductive: Minimal prostatic enlargement.  Other: No free air or free fluid.  No acute inflammatory process.  Musculoskeletal: Unremarkable  IMPRESSION: Intermediate attenuation "mass" at inferior bladder posteriorly 4.8 x 3.5 x 2.0 cm, question clot versus tumor, not felt to be related to prostate gland; recommend correlation with cystoscopy.  Stable LEFT adrenal nodule 2.5 x 2.2 cm.  Probable small renal  cysts.  Mild sigmoid diverticulosis without evidence of diverticulitis.  Bronchitic changes and volume loss in the lower lobes.  Aortic Atherosclerosis (ICD10-I70.0).   Electronically Signed By: Ulyses Southward M.D. On: 07/02/2020 12:24   Assessment & Plan:    1. Nocturia (Primary) *** - Urinalysis, Routine w reflex microscopic  2. Gross hematuria ***   No follow-ups on file.  Wilkie Aye, MD  [facility] [place]

## 2024-02-06 DIAGNOSIS — H6123 Impacted cerumen, bilateral: Secondary | ICD-10-CM | POA: Diagnosis not present

## 2024-02-06 DIAGNOSIS — E119 Type 2 diabetes mellitus without complications: Secondary | ICD-10-CM | POA: Diagnosis not present

## 2024-02-06 DIAGNOSIS — I10 Essential (primary) hypertension: Secondary | ICD-10-CM | POA: Diagnosis not present

## 2024-03-07 DIAGNOSIS — I10 Essential (primary) hypertension: Secondary | ICD-10-CM | POA: Diagnosis not present

## 2024-03-07 DIAGNOSIS — J441 Chronic obstructive pulmonary disease with (acute) exacerbation: Secondary | ICD-10-CM | POA: Diagnosis not present

## 2024-03-09 DIAGNOSIS — M79671 Pain in right foot: Secondary | ICD-10-CM | POA: Diagnosis not present

## 2024-03-09 DIAGNOSIS — M79672 Pain in left foot: Secondary | ICD-10-CM | POA: Diagnosis not present

## 2024-03-09 DIAGNOSIS — L110 Acquired keratosis follicularis: Secondary | ICD-10-CM | POA: Diagnosis not present

## 2024-03-09 DIAGNOSIS — I739 Peripheral vascular disease, unspecified: Secondary | ICD-10-CM | POA: Diagnosis not present

## 2024-03-09 DIAGNOSIS — E1140 Type 2 diabetes mellitus with diabetic neuropathy, unspecified: Secondary | ICD-10-CM | POA: Diagnosis not present

## 2024-03-31 DIAGNOSIS — I952 Hypotension due to drugs: Secondary | ICD-10-CM | POA: Diagnosis not present

## 2024-03-31 DIAGNOSIS — J441 Chronic obstructive pulmonary disease with (acute) exacerbation: Secondary | ICD-10-CM | POA: Diagnosis not present

## 2024-03-31 DIAGNOSIS — E119 Type 2 diabetes mellitus without complications: Secondary | ICD-10-CM | POA: Diagnosis not present

## 2024-04-27 DIAGNOSIS — E1165 Type 2 diabetes mellitus with hyperglycemia: Secondary | ICD-10-CM | POA: Diagnosis not present

## 2024-04-27 DIAGNOSIS — I10 Essential (primary) hypertension: Secondary | ICD-10-CM | POA: Diagnosis not present

## 2024-04-27 DIAGNOSIS — R600 Localized edema: Secondary | ICD-10-CM | POA: Diagnosis not present

## 2024-05-14 DIAGNOSIS — J449 Chronic obstructive pulmonary disease, unspecified: Secondary | ICD-10-CM | POA: Diagnosis not present

## 2024-05-14 DIAGNOSIS — E1165 Type 2 diabetes mellitus with hyperglycemia: Secondary | ICD-10-CM | POA: Diagnosis not present

## 2024-05-14 DIAGNOSIS — M170 Bilateral primary osteoarthritis of knee: Secondary | ICD-10-CM | POA: Diagnosis not present

## 2024-05-14 DIAGNOSIS — I10 Essential (primary) hypertension: Secondary | ICD-10-CM | POA: Diagnosis not present

## 2024-05-18 DIAGNOSIS — M79671 Pain in right foot: Secondary | ICD-10-CM | POA: Diagnosis not present

## 2024-05-18 DIAGNOSIS — E1140 Type 2 diabetes mellitus with diabetic neuropathy, unspecified: Secondary | ICD-10-CM | POA: Diagnosis not present

## 2024-05-18 DIAGNOSIS — M79672 Pain in left foot: Secondary | ICD-10-CM | POA: Diagnosis not present

## 2024-05-18 DIAGNOSIS — I739 Peripheral vascular disease, unspecified: Secondary | ICD-10-CM | POA: Diagnosis not present

## 2024-05-18 DIAGNOSIS — L110 Acquired keratosis follicularis: Secondary | ICD-10-CM | POA: Diagnosis not present

## 2024-05-26 DIAGNOSIS — H401123 Primary open-angle glaucoma, left eye, severe stage: Secondary | ICD-10-CM | POA: Diagnosis not present

## 2024-05-26 DIAGNOSIS — H401112 Primary open-angle glaucoma, right eye, moderate stage: Secondary | ICD-10-CM | POA: Diagnosis not present

## 2024-05-26 DIAGNOSIS — H47233 Glaucomatous optic atrophy, bilateral: Secondary | ICD-10-CM | POA: Diagnosis not present

## 2024-05-26 DIAGNOSIS — H524 Presbyopia: Secondary | ICD-10-CM | POA: Diagnosis not present

## 2024-06-14 DIAGNOSIS — I10 Essential (primary) hypertension: Secondary | ICD-10-CM | POA: Diagnosis not present

## 2024-06-14 DIAGNOSIS — J441 Chronic obstructive pulmonary disease with (acute) exacerbation: Secondary | ICD-10-CM | POA: Diagnosis not present

## 2024-07-15 DIAGNOSIS — I10 Essential (primary) hypertension: Secondary | ICD-10-CM | POA: Diagnosis not present

## 2024-07-15 DIAGNOSIS — J441 Chronic obstructive pulmonary disease with (acute) exacerbation: Secondary | ICD-10-CM | POA: Diagnosis not present

## 2024-07-31 DIAGNOSIS — L110 Acquired keratosis follicularis: Secondary | ICD-10-CM | POA: Diagnosis not present

## 2024-07-31 DIAGNOSIS — E1140 Type 2 diabetes mellitus with diabetic neuropathy, unspecified: Secondary | ICD-10-CM | POA: Diagnosis not present

## 2024-07-31 DIAGNOSIS — M79671 Pain in right foot: Secondary | ICD-10-CM | POA: Diagnosis not present

## 2024-07-31 DIAGNOSIS — I739 Peripheral vascular disease, unspecified: Secondary | ICD-10-CM | POA: Diagnosis not present

## 2024-07-31 DIAGNOSIS — M79672 Pain in left foot: Secondary | ICD-10-CM | POA: Diagnosis not present

## 2024-08-14 DIAGNOSIS — I10 Essential (primary) hypertension: Secondary | ICD-10-CM | POA: Diagnosis not present

## 2024-08-14 DIAGNOSIS — J441 Chronic obstructive pulmonary disease with (acute) exacerbation: Secondary | ICD-10-CM | POA: Diagnosis not present

## 2024-08-28 DIAGNOSIS — Z23 Encounter for immunization: Secondary | ICD-10-CM | POA: Diagnosis not present

## 2025-01-13 ENCOUNTER — Ambulatory Visit: Admitting: Urology
